# Patient Record
Sex: Female | Born: 1985 | Race: White | Hispanic: No | Marital: Married | Smoking: Never smoker
Health system: Southern US, Community
[De-identification: ages and names within clinical notes are randomized; demographics above are authoritative.]

## PROBLEM LIST (undated history)

## (undated) ENCOUNTER — Inpatient Hospital Stay (HOSPITAL_COMMUNITY): Payer: Self-pay

## (undated) DIAGNOSIS — Z8541 Personal history of malignant neoplasm of cervix uteri: Secondary | ICD-10-CM

## (undated) DIAGNOSIS — C539 Malignant neoplasm of cervix uteri, unspecified: Secondary | ICD-10-CM

## (undated) DIAGNOSIS — J45909 Unspecified asthma, uncomplicated: Secondary | ICD-10-CM

## (undated) DIAGNOSIS — R87629 Unspecified abnormal cytological findings in specimens from vagina: Secondary | ICD-10-CM

## (undated) HISTORY — PX: TONSILLECTOMY: SUR1361

## (undated) HISTORY — PX: WISDOM TOOTH EXTRACTION: SHX21

## (undated) HISTORY — DX: Unspecified abnormal cytological findings in specimens from vagina: R87.629

---

## 2003-10-17 HISTORY — PX: WISDOM TOOTH EXTRACTION: SHX21

## 2005-10-16 DIAGNOSIS — C539 Malignant neoplasm of cervix uteri, unspecified: Secondary | ICD-10-CM

## 2005-10-16 DIAGNOSIS — Z8541 Personal history of malignant neoplasm of cervix uteri: Secondary | ICD-10-CM

## 2005-10-16 HISTORY — PX: LEEP: SHX91

## 2005-10-16 HISTORY — PX: APPENDECTOMY: SHX54

## 2005-10-16 HISTORY — DX: Malignant neoplasm of cervix uteri, unspecified: C53.9

## 2005-10-16 HISTORY — DX: Personal history of malignant neoplasm of cervix uteri: Z85.41

## 2007-08-18 ENCOUNTER — Emergency Department (HOSPITAL_COMMUNITY): Admission: EM | Admit: 2007-08-18 | Discharge: 2007-08-18 | Payer: Self-pay | Admitting: Emergency Medicine

## 2009-01-10 ENCOUNTER — Emergency Department (HOSPITAL_COMMUNITY): Admission: EM | Admit: 2009-01-10 | Discharge: 2009-01-10 | Payer: Self-pay | Admitting: Emergency Medicine

## 2010-04-29 ENCOUNTER — Encounter: Admission: RE | Admit: 2010-04-29 | Discharge: 2010-04-29 | Payer: Self-pay | Admitting: Family Medicine

## 2011-07-25 LAB — RAPID URINE DRUG SCREEN, HOSP PERFORMED
Amphetamines: NOT DETECTED
Barbiturates: NOT DETECTED
Benzodiazepines: NOT DETECTED
Cocaine: NOT DETECTED
Opiates: NOT DETECTED
Tetrahydrocannabinol: NOT DETECTED

## 2011-07-25 LAB — POCT CARDIAC MARKERS
CKMB, poc: <1 — ABNORMAL LOW
Myoglobin, poc: 38.7
Operator id: 272551
Troponin i, poc: <0.05

## 2011-07-25 LAB — D-DIMER, QUANTITATIVE: D-Dimer, Quant: 0.24

## 2012-02-29 ENCOUNTER — Ambulatory Visit: Payer: Self-pay | Admitting: Emergency Medicine

## 2012-02-29 VITALS — BP 134/82 | HR 90 | Temp 97.8°F | Resp 18 | Ht 66.0 in | Wt 133.0 lb

## 2012-02-29 DIAGNOSIS — R1110 Vomiting, unspecified: Secondary | ICD-10-CM

## 2012-02-29 DIAGNOSIS — R197 Diarrhea, unspecified: Secondary | ICD-10-CM

## 2012-02-29 DIAGNOSIS — E860 Dehydration: Secondary | ICD-10-CM

## 2012-02-29 LAB — POCT CBC
HCT, POC: 43.8 % (ref 37.7–47.9)
MCHC: 34.2 g/dL (ref 31.8–35.4)
MCV: 94.1 fL (ref 80–97)
MID (cbc): 0.4 (ref 0–0.9)
MPV: 11.4 fL (ref 0–99.8)
POC Granulocyte: 10.7 — AB (ref 2–6.9)
POC MID %: 3.2 %M (ref 0–12)
RBC: 4.65 M/uL (ref 4.04–5.48)
RDW, POC: 12.6 %
WBC: 12.2 10*3/uL — AB (ref 4.6–10.2)

## 2012-02-29 MED ORDER — ONDANSETRON 8 MG PO TBDP: 8.0000 mg | ORAL_TABLET | Freq: Three times a day (TID) | ORAL | Status: AC | PRN

## 2012-02-29 MED ORDER — ONDANSETRON 4 MG PO TBDP
8.0000 mg | ORAL_TABLET | Freq: Once | ORAL | Status: AC
  Administered 2012-02-29: 8 mg via ORAL

## 2012-02-29 NOTE — Patient Instructions (Signed)
Nausea and Vomiting  Nausea is a sick feeling that often comes before throwing up (vomiting). Vomiting is a reflex where stomach contents come out of your mouth. Vomiting can cause severe loss of body fluids (dehydration). Children and elderly adults can become dehydrated quickly, especially if they also have diarrhea. Nausea and vomiting are symptoms of a condition or disease. It is important to find the cause of your symptoms.  CAUSES    Direct irritation of the stomach lining. This irritation can result from increased acid production (gastroesophageal reflux disease), infection, food poisoning, taking certain medicines (such as nonsteroidal anti-inflammatory drugs), alcohol use, or tobacco use.   Signals from the brain.These signals could be caused by a headache, heat exposure, an inner ear disturbance, increased pressure in the brain from injury, infection, a tumor, or a concussion, pain, emotional stimulus, or metabolic problems.   An obstruction in the gastrointestinal tract (bowel obstruction).   Illnesses such as diabetes, hepatitis, gallbladder problems, appendicitis, kidney problems, cancer, sepsis, atypical symptoms of a heart attack, or eating disorders.   Medical treatments such as chemotherapy and radiation.   Receiving medicine that makes you sleep (general anesthetic) during surgery.  DIAGNOSIS  Your caregiver may ask for tests to be done if the problems do not improve after a few days. Tests may also be done if symptoms are severe or if the reason for the nausea and vomiting is not clear. Tests may include:   Urine tests.   Blood tests.   Stool tests.   Cultures (to look for evidence of infection).   X-rays or other imaging studies.  Test results can help your caregiver make decisions about treatment or the need for additional tests.  TREATMENT  You need to stay well hydrated. Drink frequently but in small amounts.You may wish to drink water, sports drinks, clear broth, or eat frozen  ice pops or gelatin dessert to help stay hydrated.When you eat, eating slowly may help prevent nausea.There are also some antinausea medicines that may help prevent nausea.  HOME CARE INSTRUCTIONS    Take all medicine as directed by your caregiver.   If you do not have an appetite, do not force yourself to eat. However, you must continue to drink fluids.   If you have an appetite, eat a normal diet unless your caregiver tells you differently.   Eat a variety of complex carbohydrates (rice, wheat, potatoes, bread), lean meats, yogurt, fruits, and vegetables.   Avoid high-fat foods because they are more difficult to digest.   Drink enough water and fluids to keep your urine clear or pale yellow.   If you are dehydrated, ask your caregiver for specific rehydration instructions. Signs of dehydration may include:   Severe thirst.   Dry lips and mouth.   Dizziness.   Dark urine.   Decreasing urine frequency and amount.   Confusion.   Rapid breathing or pulse.  SEEK IMMEDIATE MEDICAL CARE IF:    You have blood or brown flecks (like coffee grounds) in your vomit.   You have black or bloody stools.   You have a severe headache or stiff neck.   You are confused.   You have severe abdominal pain.   You have chest pain or trouble breathing.   You do not urinate at least once every 8 hours.   You develop cold or clammy skin.   You continue to vomit for longer than 24 to 48 hours.   You have a fever.  MAKE SURE YOU:      Understand these instructions.   Will watch your condition.   Will get help right away if you are not doing well or get worse.  Document Released: 10/02/2005 Document Revised: 09/21/2011 Document Reviewed: 03/01/2011  [employer] Patient Information 2012 [employer].

## 2012-02-29 NOTE — Progress Notes (Signed)
Addended by: Lesle Chris A on: 02/29/2012 05:15 PM   Modules accepted: Orders

## 2012-02-29 NOTE — Progress Notes (Signed)
  Subjective:    Patient ID: Michelle Copeland, female    DOB: 06-14-1986, 26 y.o.   MRN: [id number]  HPI patient started him bad last night. She started having frequent loose stools the today she's had persistent nausea and vomiting inability to stop vomiting.    Review of Systems     Objective:   Physical Exam HEENT exam reveals a young female who appears ill. She does not appear toxic. Her mucous membranes are slightly dry . Her neck is supple. Chest is clear to auscultation and percussion. The abdomen is flat. Bowel sounds are normal there is mild midepigastric tenderness.   Results for orders placed in visit on 02/29/12  POCT CBC      Component Value Range   WBC 12.2 (*) 4.6 - 10.2 (K/uL)   Lymph, poc 1.2  0.6 - 3.4    POC LYMPH PERCENT 9.5 (*) 10 - 50 (%L)   MID (cbc) 0.4  0 - 0.9    POC MID % 3.2  0 - 12 (%M)   POC Granulocyte 10.7 (*) 2 - 6.9    Granulocyte percent 87.3 (*) 37 - 80 (%G)   RBC 4.65  4.04 - 5.48 (M/uL)   Hemoglobin 15.0  12.2 - 16.2 (g/dL)   HCT, POC 24.4  01.0 - 47.9 (%)   MCV 94.1  80 - 97 (fL)   MCH, POC 32.3 (*) 27 - 31.2 (pg)   MCHC 34.2  31.8 - 35.4 (g/dL)   RDW, POC 27.2     Platelet Count, POC 267  142 - 424 (K/uL)   MPV 11.4  0 - 99.8 (fL)       Assessment & Plan:  Assessment as nausea vomiting diarrhea with severe headache no fever consistent with a viral type gastroenteritis. We'll give Zofran by mouth ODT and start IV fluids. Her white count is up slightly however she is in the acute phase of her illness and I suspect this is still more a reflection of an early viral type illness. Patient is significantly better after Zofran and IV fluids. We will put her on by mouth Zofran and instructions for vomiting and diarrhea to return to clinic if not improving in 24-48 hours

## 2013-10-14 ENCOUNTER — Encounter: Payer: Self-pay | Admitting: *Deleted

## 2013-10-14 ENCOUNTER — Other Ambulatory Visit (INDEPENDENT_AMBULATORY_CARE_PROVIDER_SITE_OTHER): Payer: BC Managed Care – PPO | Admitting: *Deleted

## 2013-10-14 DIAGNOSIS — N926 Irregular menstruation, unspecified: Secondary | ICD-10-CM

## 2013-10-14 LAB — HIV ANTIBODY (ROUTINE TESTING W REFLEX): HIV: NONREACTIVE

## 2013-10-14 LAB — POCT PREGNANCY, URINE: Preg Test, Ur: POSITIVE — AB

## 2013-10-15 LAB — OBSTETRIC PANEL
Antibody Screen: NEGATIVE
Eosinophils Relative: 2 % (ref 0–5)
HCT: 41.2 % (ref 36.0–46.0)
Lymphocytes Relative: 28 % (ref 12–46)
Lymphs Abs: 1.6 10*3/uL (ref 0.7–4.0)
MCH: 32.2 pg (ref 26.0–34.0)
MCHC: 34.2 g/dL (ref 30.0–36.0)
Neutro Abs: 3.7 10*3/uL (ref 1.7–7.7)
RBC: 4.38 MIL/uL (ref 3.87–5.11)
RDW: 12.8 % (ref 11.5–15.5)
Rubella: 1.11 Index — ABNORMAL HIGH (ref <0.90)
WBC: 5.8 10*3/uL (ref 4.0–10.5)

## 2013-10-16 NOTE — L&D Delivery Note (Signed)
Delivery Note Pt reached complete dilation and pushed great.  At 10:01 PM a healthy female was delivered via Vaginal, Spontaneous Delivery (Presentation: ; Occiput Anterior).  APGAR: 8, 9; weight pending .   Placenta status: Intact, Spontaneous.  Cord: 3 vessels with the following complications: None.   Anesthesia: Epidural  Episiotomy: None Lacerations: 1st degree Suture Repair: 3.0 vicryl rapide Est. Blood Loss (mL): 350cc  Mom to postpartum.  Baby to Couplet care / Skin to Skin.  Logan Bores 05/30/2014, 10:19 PM

## 2013-11-04 ENCOUNTER — Encounter: Payer: Self-pay | Admitting: Obstetrics and Gynecology

## 2013-11-04 ENCOUNTER — Ambulatory Visit (HOSPITAL_COMMUNITY)
Admission: RE | Admit: 2013-11-04 | Discharge: 2013-11-04 | Disposition: A | Discharge status: Home / Self Care | Payer: BC Managed Care – PPO | Source: Ambulatory Visit | Attending: Obstetrics and Gynecology | Admitting: Obstetrics and Gynecology

## 2013-11-04 ENCOUNTER — Ambulatory Visit (INDEPENDENT_AMBULATORY_CARE_PROVIDER_SITE_OTHER): Payer: BC Managed Care – PPO | Admitting: Obstetrics and Gynecology

## 2013-11-04 VITALS — BP 122/81 | Temp 97.6°F | Wt 139.4 lb

## 2013-11-04 DIAGNOSIS — Z3689 Encounter for other specified antenatal screening: Secondary | ICD-10-CM | POA: Insufficient documentation

## 2013-11-04 DIAGNOSIS — Z3401 Encounter for supervision of normal first pregnancy, first trimester: Secondary | ICD-10-CM

## 2013-11-04 DIAGNOSIS — Z3400 Encounter for supervision of normal first pregnancy, unspecified trimester: Secondary | ICD-10-CM

## 2013-11-04 DIAGNOSIS — O3441 Maternal care for other abnormalities of cervix, first trimester: Secondary | ICD-10-CM | POA: Insufficient documentation

## 2013-11-04 DIAGNOSIS — Z9889 Other specified postprocedural states: Secondary | ICD-10-CM

## 2013-11-04 LAB — POCT URINALYSIS DIP (DEVICE)
Bilirubin Urine: NEGATIVE
Glucose, UA: NEGATIVE mg/dL
Hgb urine dipstick: NEGATIVE
Leukocytes, UA: NEGATIVE
Nitrite: NEGATIVE
PROTEIN: NEGATIVE mg/dL
Specific Gravity, Urine: 1.02 (ref 1.005–1.030)
Urobilinogen, UA: 0.2 mg/dL (ref 0.0–1.0)
pH: 5.5 (ref 5.0–8.0)

## 2013-11-04 NOTE — Addendum Note (Signed)
Addended by: Ernie Avena on: 11/04/2013 03:41 PM   Modules accepted: Orders

## 2013-11-04 NOTE — Progress Notes (Signed)
P=84,  Here for initial OB. States LMP around 08/04/13, not sure of exact date. Given new patient information. Discussed bmi/ appropriate weight gain. Last pap June, 2014.

## 2013-11-04 NOTE — Patient Instructions (Addendum)
Pregnancy - First Trimester  During sexual intercourse, millions of sperm go into the vagina. Only 1 sperm will penetrate and fertilize the female egg while it is in the Fallopian tube. One week later, the fertilized egg implants into the wall of the uterus. An embryo begins to develop into a baby. At 6 to 8 weeks, the eyes and face are formed and the heartbeat can be seen on ultrasound. At the end of 12 weeks (first trimester), all the baby's organs are formed. Now that you are pregnant, you will want to do everything you can to have a healthy baby. Two of the most important things are to get good prenatal care and follow your caregiver's instructions. Prenatal care is all the medical care you receive before the baby's birth. It is given to prevent, find, and treat problems during the pregnancy and childbirth.  PRENATAL EXAMS  · During prenatal visits, your weight, blood pressure, and urine are checked. This is done to make sure you are healthy and progressing normally during the pregnancy.  · A pregnant woman should gain 25 to 35 pounds during the pregnancy. However, if you are overweight or underweight, your caregiver will advise you regarding your weight.  · Your caregiver will ask and answer questions for you.  · Blood work, cervical cultures, other necessary tests, and a Pap test are done during your prenatal exams. These tests are done to check on your health and the probable health of your baby. Tests are strongly recommended and done for HIV with your permission. This is the virus that causes AIDS. These tests are done because medicines can be given to help prevent your baby from being born with this infection should you have been infected without knowing it. Blood work is also used to find out your blood type, previous infections, and follow your blood levels (hemoglobin).  · Low hemoglobin (anemia) is common during pregnancy. Iron and vitamins are given to help prevent this. Later in the pregnancy, blood  tests for diabetes will be done along with any other tests if any problems develop.  · You may need other tests to make sure you and the baby are doing well.  CHANGES DURING THE FIRST TRIMESTER   Your body goes through many changes during pregnancy. They vary from person to person. Talk to your caregiver about changes you notice and are concerned about. Changes can include:  · Your menstrual period stops.  · The egg and sperm carry the genes that determine what you look like. Genes from you and your partner are forming a baby. The female genes determine whether the baby is a boy or a girl.  · Your body increases in girth and you may feel bloated.  · Feeling sick to your stomach (nauseous) and throwing up (vomiting). If the vomiting is uncontrollable, call your caregiver.  · Your breasts will begin to enlarge and become tender.  · Your nipples may stick out more and become darker.  · The need to urinate more. Painful urination may mean you have a bladder infection.  · Tiring easily.  · Loss of appetite.  · Cravings for certain kinds of food.  · At first, you may gain or lose a couple of pounds.  · You may have changes in your emotions from day to day (excited to be pregnant or concerned something may go wrong with the pregnancy and baby).  · You may have more vivid and strange dreams.  HOME CARE INSTRUCTIONS   ·   It is very important to avoid all smoking, alcohol and non-prescribed drugs during your pregnancy. These affect the formation and growth of the baby. Avoid chemicals while pregnant to ensure the delivery of a healthy infant.  · Start your prenatal visits by the 12th week of pregnancy. They are usually scheduled monthly at first, then more often in the last 2 months before delivery. Keep your caregiver's appointments. Follow your caregiver's instructions regarding medicine use, blood and lab tests, exercise, and diet.  · During pregnancy, you are providing food for you and your baby. Eat regular, well-balanced  meals. Choose foods such as meat, fish, milk and other low fat dairy products, vegetables, fruits, and whole-grain breads and cereals. Your caregiver will tell you of the ideal weight gain.  · You can help morning sickness by keeping soda crackers at the bedside. Eat a couple before arising in the morning. You may want to use the crackers without salt on them.  · Eating 4 to 5 small meals rather than 3 large meals a day also may help the nausea and vomiting.  · Drinking liquids between meals instead of during meals also seems to help nausea and vomiting.  · A physical sexual relationship may be continued throughout pregnancy if there are no other problems. Problems may be early (premature) leaking of amniotic fluid from the membranes, vaginal bleeding, or belly (abdominal) pain.  · Exercise regularly if there are no restrictions. Check with your caregiver or physical therapist if you are unsure of the safety of some of your exercises. Greater weight gain will occur in the last 2 trimesters of pregnancy. Exercising will help:  · Control your weight.  · Keep you in shape.  · Prepare you for labor and delivery.  · Help you lose your pregnancy weight after you deliver your baby.  · Wear a good support or jogging bra for breast tenderness during pregnancy. This may help if worn during sleep too.  · Ask when prenatal classes are available. Begin classes when they are offered.  · Do not use hot tubs, steam rooms, or saunas.  · Wear your seat belt when driving. This protects you and your baby if you are in an accident.  · Avoid raw meat, uncooked cheese, cat litter boxes, and soil used by cats throughout the pregnancy. These carry germs that can cause birth defects in the baby.  · The first trimester is a good time to visit your dentist for your dental health. Getting your teeth cleaned is okay. Use a softer toothbrush and brush gently during pregnancy.  · Ask for help if you have financial, counseling, or nutritional needs  during pregnancy. Your caregiver will be able to offer counseling for these needs as well as refer you for other special needs.  · Do not take any medicines or herbs unless told by your caregiver.  · Inform your caregiver if there is any mental or physical domestic violence.  · Make a list of emergency phone numbers of family, friends, hospital, and police and fire departments.  · Write down your questions. Take them to your prenatal visit.  · Do not douche.  · Do not cross your legs.  · If you have to stand for long periods of time, rotate you feet or take small steps in a circle.  · You may have more vaginal secretions that may require a sanitary pad. Do not use tampons or scented sanitary pads.  MEDICINES AND DRUG USE IN PREGNANCY  ·   Take prenatal vitamins as directed. The vitamin should contain 1 milligram of folic acid. Keep all vitamins out of reach of children. Only a couple vitamins or tablets containing iron may be fatal to a baby or young child when ingested.  · Avoid use of all medicines, including herbs, over-the-counter medicines, not prescribed or suggested by your caregiver. Only take over-the-counter or prescription medicines for pain, discomfort, or fever as directed by your caregiver. Do not use aspirin, ibuprofen, or naproxen unless directed by your caregiver.  · Let your caregiver also know about herbs you may be using.  · Alcohol is related to a number of birth defects. This includes fetal alcohol syndrome. All alcohol, in any form, should be avoided completely. Smoking will cause low birth rate and premature babies.  · Street or illegal drugs are very harmful to the baby. They are absolutely forbidden. A baby born to an addicted mother will be addicted at birth. The baby will go through the same withdrawal an adult does.  · Let your caregiver know about any medicines that you have to take and for what reason you take them.  SEEK MEDICAL CARE IF:   You have any concerns or worries during your  pregnancy. It is better to call with your questions if you feel they cannot wait, rather than worry about them.  SEEK IMMEDIATE MEDICAL CARE IF:   · An unexplained oral temperature above 102° F (38.9° C) develops, or as your caregiver suggests.  · You have leaking of fluid from the vagina (birth canal). If leaking membranes are suspected, take your temperature and inform your caregiver of this when you call.  · There is vaginal spotting or bleeding. Notify your caregiver of the amount and how many pads are used.  · You develop a bad smelling vaginal discharge with a change in the color.  · You continue to feel sick to your stomach (nauseated) and have no relief from remedies suggested. You vomit blood or coffee ground-like materials.  · You lose more than 2 pounds of weight in 1 week.  · You gain more than 2 pounds of weight in 1 week and you notice swelling of your face, hands, feet, or legs.  · You gain 5 pounds or more in 1 week (even if you do not have swelling of your hands, face, legs, or feet).  · You get exposed to German measles and have never had them.  · You are exposed to fifth disease or chickenpox.  · You develop belly (abdominal) pain. Round ligament discomfort is a common non-cancerous (benign) cause of abdominal pain in pregnancy. Your caregiver still must evaluate this.  · You develop headache, fever, diarrhea, pain with urination, or shortness of breath.  · You fall or are in a car accident or have any kind of trauma.  · There is mental or physical violence in your home.  Document Released: 09/26/2001 Document Revised: 06/26/2012 Document Reviewed: 03/30/2009  [employer]® Patient Information ©2014 [employer].

## 2013-11-04 NOTE — Progress Notes (Signed)
   Subjective:    Michelle Copeland is a G1P0 [redacted]w[redacted]d being seen today for her first obstetrical visit.  Patient does intend to breast feed. Pregnancy history fully reviewed.  Patient reports constipation, but symptoms relieved with change in PNV brand.  Filed Vitals:   11/04/13 1333  BP: 122/81  Temp: 97.6 F (36.4 C)  Weight: 139 lb 6.4 oz (63.231 kg)    HISTORY: OB History  Gravida Para Term Preterm AB SAB TAB Ectopic Multiple Living  1             # Outcome Date GA Lbr Len/2nd Weight Sex Delivery Anes PTL Lv  1 CUR              Past Medical History  Diagnosis Date  . Vaginal Pap smear, abnormal    Past Surgical History  Procedure Laterality Date  . Leep  2007  . Appendectomy  2007  . Wisdom tooth extraction  2005   Family History  Problem Relation Age of Onset  . Lupus Mother   . Hypertension Mother   . Thyroid cancer Mother   . Hyperlipidemia Mother      Exam    Uterus:     Pelvic Exam:    Perineum: Normal Perineum   Vulva: normal   Vagina:  normal mucosa, normal discharge   pH:    Cervix: no cervical motion tenderness and bleeding following PAP   Adnexa: normal adnexa and no mass, fullness, tenderness   Bony Pelvis: average  System: Breast:  normal appearance, no masses or tenderness   Skin: normal coloration and turgor, no rashes    Neurologic: oriented, normal   Extremities: normal strength, tone, and muscle mass   HEENT sclera clear, anicteric, neck supple with midline trachea and thyroid without masses   Mouth/Teeth mucous membranes moist, pharynx normal without lesions and dental hygiene good   Neck supple and no masses   Cardiovascular: regular rate and rhythm   Respiratory:  appears well, vitals normal, no respiratory distress, acyanotic, normal RR, ear and throat exam is normal, neck free of mass or lymphadenopathy, chest clear, no wheezing, crepitations, rhonchi, normal symmetric air entry   Abdomen: soft, non-tender; bowel sounds normal; no  masses,  no organomegaly   Urinary: urethral meatus normal      Assessment:    Pregnancy: G1P0 Patient Active Problem List   Diagnosis Date Noted  . Supervision of normal first pregnancy in first trimester 11/04/2013  . History of LEEP (loop electrosurgical excision procedure) of cervix complicating pregnancy in first trimester 11/04/2013        Plan:     Initial labs drawn. Prenatal vitamins. Problem list reviewed and updated. Genetic Screening discussed First Screen: declined.  Ultrasound discussed; fetal survey: ordered.  Follow up in 4 weeks. 50% of 30 min visit spent on counseling and coordination of care.     Ivin Booty 11/04/2013

## 2013-11-05 LAB — PRESCRIPTION MONITORING PROFILE (19 PANEL)
AMPHETAMINE/METH: NEGATIVE ng/mL
BARBITURATE SCREEN, URINE: NEGATIVE ng/mL
Benzodiazepine Screen, Urine: NEGATIVE ng/mL
Buprenorphine, Urine: NEGATIVE ng/mL
CARISOPRODOL, URINE: NEGATIVE ng/mL
COCAINE METABOLITES: NEGATIVE ng/mL
Cannabinoid Scrn, Ur: NEGATIVE ng/mL
Creatinine, Urine: 146.73 mg/dL (ref >20.0)
ECSTASY: NEGATIVE ng/mL
Fentanyl, Ur: NEGATIVE ng/mL
MEPERIDINE UR: NEGATIVE ng/mL
METHAQUALONE SCREEN (URINE): NEGATIVE ng/mL
Methadone Screen, Urine: NEGATIVE ng/mL
NITRITES URINE, INITIAL: NEGATIVE ug/mL
OPIATE SCREEN, URINE: NEGATIVE ng/mL
OXYCODONE SCRN UR: NEGATIVE ng/mL
PH URINE, INITIAL: 5.8 pH (ref 4.5–8.9)
PROPOXYPHENE: NEGATIVE ng/mL
Phencyclidine, Ur: NEGATIVE ng/mL
TAPENTADOLUR: NEGATIVE ng/mL
TRAMADOL UR: NEGATIVE ng/mL
Zolpidem, Urine: NEGATIVE ng/mL

## 2013-11-06 LAB — CULTURE, OB URINE
COLONY COUNT: NO GROWTH
ORGANISM ID, BACTERIA: NO GROWTH

## 2013-12-02 ENCOUNTER — Encounter: Payer: BC Managed Care – PPO | Admitting: Family

## 2014-03-06 LAB — OB RESULTS CONSOLE GC/CHLAMYDIA
Chlamydia: NEGATIVE
Gonorrhea: NEGATIVE

## 2014-05-03 ENCOUNTER — Inpatient Hospital Stay (HOSPITAL_COMMUNITY)
Admission: AD | Admit: 2014-05-03 | Discharge: 2014-05-03 | Disposition: A | Discharge status: Home / Self Care | Payer: BC Managed Care – PPO | Source: Ambulatory Visit | Attending: Obstetrics and Gynecology | Admitting: Obstetrics and Gynecology

## 2014-05-03 ENCOUNTER — Encounter (HOSPITAL_COMMUNITY): Payer: Self-pay | Admitting: *Deleted

## 2014-05-03 DIAGNOSIS — Z8541 Personal history of malignant neoplasm of cervix uteri: Secondary | ICD-10-CM | POA: Insufficient documentation

## 2014-05-03 DIAGNOSIS — O4700 False labor before 37 completed weeks of gestation, unspecified trimester: Secondary | ICD-10-CM | POA: Insufficient documentation

## 2014-05-03 HISTORY — DX: Malignant neoplasm of cervix uteri, unspecified: C53.9

## 2014-05-03 LAB — URINALYSIS, ROUTINE W REFLEX MICROSCOPIC
BILIRUBIN URINE: NEGATIVE
Glucose, UA: NEGATIVE mg/dL
Ketones, ur: NEGATIVE mg/dL
LEUKOCYTES UA: NEGATIVE
Nitrite: NEGATIVE
PROTEIN: NEGATIVE mg/dL
Specific Gravity, Urine: <1.005 — ABNORMAL LOW (ref 1.005–1.030)
UROBILINOGEN UA: 0.2 mg/dL (ref 0.0–1.0)
pH: 6 (ref 5.0–8.0)

## 2014-05-03 LAB — URINE MICROSCOPIC-ADD ON

## 2014-05-03 MED ORDER — LACTATED RINGERS IV BOLUS (SEPSIS)
1000.0000 mL | Freq: Once | INTRAVENOUS | Status: AC
  Administered 2014-05-03: 1000 mL via INTRAVENOUS

## 2014-05-03 MED ORDER — ZOLPIDEM TARTRATE 5 MG PO TABS
5.0000 mg | ORAL_TABLET | Freq: Once | ORAL | Status: AC
  Administered 2014-05-03: 5 mg via ORAL
  Filled 2014-05-03: qty 1

## 2014-05-03 MED ORDER — LACTATED RINGERS IV SOLN
INTRAVENOUS | Status: DC
  Administered 2014-05-03: 05:00:00 via INTRAVENOUS

## 2014-05-03 NOTE — MAU Note (Signed)
Reports having contractions that started around 2315 anywhere from 5-14 minutes apart. Called Dr Melba Coon and was instructed to lay on left side and drink water however that has not helped. Denies VB and LOF. Reports good FM

## 2014-05-03 NOTE — MAU Provider Note (Signed)
  History     CSN: [id number]  Arrival date and time: 05/03/14 0234   First Provider Initiated Contact with Patient 05/03/14 0301      No chief complaint on file.  HPI Pt is G1P0 @ [redacted]weeks gestation.  Pt started having intense ctx/abd pain  around 2315 from 5 to 14 minutes.  Pt drank Water and tried to get comfortable on her left side without any improvement of her pain.  Pt denies vaginal bleeding or Loss of fluid.  Pt's hx is significant for cervical cancer treated with cryo, laser and radiation.   Registered Nurse Signed MAU Note Service date: 05/03/2014 2:51 AM   Reports having contractions that started around 2315 anywhere from 5-14 minutes apart. Called Dr Melba Coon and was instructed to lay on left side and drink water however that has not helped. Denies VB and LOF. Reports good FM     Past Medical History  Diagnosis Date  . Vaginal Pap smear, abnormal   . Cervical cancer 2007    Treated with radiation, laser and cryo    Past Surgical History  Procedure Laterality Date  . Leep  2007  . Appendectomy  2007  . Wisdom tooth extraction  2005  . Tonsillectomy    . Wisdom tooth extraction      Family History  Problem Relation Age of Onset  . Lupus Mother   . Hypertension Mother   . Thyroid cancer Mother   . Hyperlipidemia Mother     History  Substance Use Topics  . Smoking status: Never Smoker   . Smokeless tobacco: Never Used  . Alcohol Use: Yes     Comment: stopped since pregant    Allergies:  Allergies  Allergen Reactions  . Codeine Hives    Prescriptions prior to admission  Medication Sig Dispense Refill  . Prenatal Vit-Fe Fumarate-FA (PRENATAL VITAMINS PLUS PO) Take 1 tablet by mouth daily.        Review of Systems  Constitutional: Negative for fever and chills.  Gastrointestinal: Positive for abdominal pain. Negative for nausea, vomiting, diarrhea and constipation.  Genitourinary: Negative for dysuria.   Physical Exam   Blood pressure 124/66,  pulse 90, temperature 98 F (36.7 C), temperature source Oral, resp. rate 20, height 5\' 6"  (1.676 m), weight 168 lb (76.204 kg), last menstrual period 09/04/2013.  Physical Exam  Nursing note and vitals reviewed. Constitutional: She is oriented to person, place, and time. She appears well-developed and well-nourished.  Appears uncomfortable  HENT:  Head: Normocephalic.  Eyes: Pupils are equal, round, and reactive to light.  Neck: Normal range of motion. Neck supple.  Cardiovascular: Normal rate.   Respiratory: Effort normal.  GI: Soft. She exhibits no distension.  Irregular contractions; FHR good variability, no decelerations  Genitourinary:  Cervix 1 cm thick -3station  Musculoskeletal: Normal range of motion.  Neurological: She is alert and oriented to person, place, and time.  Skin: Skin is warm and dry.  Psychiatric: She has a normal mood and affect.    MAU Course  Procedures Discussed with Dr. Melba Coon- will give LR bolus Care turned over to RN for labor check Assessment and Plan    Michelle Copeland 05/03/2014, 3:07 AM

## 2014-05-03 NOTE — MAU Provider Note (Signed)
  Pt in MAU with contractions.  NST - 130-135 R.  irr ctx, given IVF bolus

## 2014-05-03 NOTE — Discharge Instructions (Signed)

## 2014-05-13 LAB — OB RESULTS CONSOLE GBS: GBS: NEGATIVE

## 2014-05-30 ENCOUNTER — Encounter (HOSPITAL_COMMUNITY): Payer: Self-pay | Admitting: *Deleted

## 2014-05-30 ENCOUNTER — Inpatient Hospital Stay (HOSPITAL_COMMUNITY)
Admission: AD | Admit: 2014-05-30 | Discharge: 2014-06-01 | DRG: 775 | Disposition: A | Discharge status: Home / Self Care | Payer: BC Managed Care – PPO | Source: Ambulatory Visit | Attending: Obstetrics and Gynecology | Admitting: Obstetrics and Gynecology

## 2014-05-30 ENCOUNTER — Inpatient Hospital Stay (HOSPITAL_COMMUNITY): Payer: BC Managed Care – PPO | Admitting: Anesthesiology

## 2014-05-30 ENCOUNTER — Encounter (HOSPITAL_COMMUNITY): Payer: BC Managed Care – PPO | Admitting: Anesthesiology

## 2014-05-30 DIAGNOSIS — Z8541 Personal history of malignant neoplasm of cervix uteri: Secondary | ICD-10-CM

## 2014-05-30 DIAGNOSIS — J45909 Unspecified asthma, uncomplicated: Secondary | ICD-10-CM | POA: Diagnosis present

## 2014-05-30 DIAGNOSIS — Z8249 Family history of ischemic heart disease and other diseases of the circulatory system: Secondary | ICD-10-CM

## 2014-05-30 DIAGNOSIS — Z808 Family history of malignant neoplasm of other organs or systems: Secondary | ICD-10-CM | POA: Diagnosis not present

## 2014-05-30 DIAGNOSIS — O479 False labor, unspecified: Secondary | ICD-10-CM | POA: Diagnosis present

## 2014-05-30 HISTORY — DX: Unspecified asthma, uncomplicated: J45.909

## 2014-05-30 LAB — CBC
HCT: 42.9 % (ref 36.0–46.0)
Hemoglobin: 15.6 g/dL — ABNORMAL HIGH (ref 12.0–15.0)
MCH: 33.8 pg (ref 26.0–34.0)
MCHC: 36.4 g/dL — AB (ref 30.0–36.0)
MCV: 92.9 fL (ref 78.0–100.0)
Platelets: 202 10*3/uL (ref 150–400)
RBC: 4.62 MIL/uL (ref 3.87–5.11)
RDW: 13 % (ref 11.5–15.5)
WBC: 14.8 10*3/uL — ABNORMAL HIGH (ref 4.0–10.5)

## 2014-05-30 MED ORDER — LACTATED RINGERS IV SOLN
500.0000 mL | Freq: Once | INTRAVENOUS | Status: AC
  Administered 2014-05-30: 500 mL via INTRAVENOUS

## 2014-05-30 MED ORDER — EPHEDRINE 5 MG/ML INJ
10.0000 mg | INTRAVENOUS | Status: DC | PRN
  Filled 2014-05-30: qty 2
  Filled 2014-05-30: qty 4

## 2014-05-30 MED ORDER — LIDOCAINE HCL (PF) 1 % IJ SOLN
30.0000 mL | INTRAMUSCULAR | Status: DC | PRN
  Filled 2014-05-30: qty 30

## 2014-05-30 MED ORDER — OXYTOCIN BOLUS FROM INFUSION: 500.0000 mL | INTRAVENOUS | Status: DC

## 2014-05-30 MED ORDER — PHENYLEPHRINE 40 MCG/ML (10ML) SYRINGE FOR IV PUSH (FOR BLOOD PRESSURE SUPPORT)
80.0000 ug | PREFILLED_SYRINGE | INTRAVENOUS | Status: DC | PRN
  Administered 2014-05-30: 80 ug via INTRAVENOUS
  Filled 2014-05-30: qty 2

## 2014-05-30 MED ORDER — LACTATED RINGERS IV SOLN
INTRAVENOUS | Status: DC
  Administered 2014-05-30 (×2): via INTRAVENOUS

## 2014-05-30 MED ORDER — CITRIC ACID-SODIUM CITRATE 334-500 MG/5ML PO SOLN
30.0000 mL | ORAL | Status: DC | PRN
  Filled 2014-05-30: qty 15

## 2014-05-30 MED ORDER — ACETAMINOPHEN 325 MG PO TABS: 650.0000 mg | ORAL_TABLET | ORAL | Status: DC | PRN

## 2014-05-30 MED ORDER — DIPHENHYDRAMINE HCL 50 MG/ML IJ SOLN: 12.5000 mg | INTRAMUSCULAR | Status: DC | PRN

## 2014-05-30 MED ORDER — LACTATED RINGERS IV SOLN
500.0000 mL | INTRAVENOUS | Status: DC | PRN
  Administered 2014-05-30 (×2): 500 mL via INTRAVENOUS

## 2014-05-30 MED ORDER — FENTANYL 2.5 MCG/ML BUPIVACAINE 1/10 % EPIDURAL INFUSION (WH - ANES)
14.0000 mL/h | INTRAMUSCULAR | Status: DC | PRN
  Administered 2014-05-30: 14 mL/h via EPIDURAL
  Filled 2014-05-30: qty 125

## 2014-05-30 MED ORDER — OXYTOCIN 40 UNITS IN LACTATED RINGERS INFUSION - SIMPLE MED
62.5000 mL/h | INTRAVENOUS | Status: DC
  Administered 2014-05-30: 62.5 mL/h via INTRAVENOUS
  Filled 2014-05-30: qty 1000

## 2014-05-30 MED ORDER — IBUPROFEN 600 MG PO TABS
600.0000 mg | ORAL_TABLET | Freq: Four times a day (QID) | ORAL | Status: DC | PRN
  Administered 2014-05-30: 600 mg via ORAL
  Filled 2014-05-30: qty 1

## 2014-05-30 MED ORDER — TERBUTALINE SULFATE 1 MG/ML IJ SOLN
INTRAMUSCULAR | Status: AC
  Filled 2014-05-30: qty 1

## 2014-05-30 MED ORDER — LIDOCAINE HCL (PF) 1 % IJ SOLN
INTRAMUSCULAR | Status: DC | PRN
  Administered 2014-05-30 (×2): 5 mL

## 2014-05-30 MED ORDER — BUTORPHANOL TARTRATE 1 MG/ML IJ SOLN
1.0000 mg | INTRAMUSCULAR | Status: DC | PRN
  Administered 2014-05-30: 1 mg via INTRAVENOUS
  Filled 2014-05-30: qty 1

## 2014-05-30 MED ORDER — ONDANSETRON HCL 4 MG/2ML IJ SOLN: 4.0000 mg | Freq: Four times a day (QID) | INTRAMUSCULAR | Status: DC | PRN

## 2014-05-30 MED ORDER — EPHEDRINE 5 MG/ML INJ
10.0000 mg | INTRAVENOUS | Status: DC | PRN
  Filled 2014-05-30: qty 2

## 2014-05-30 MED ORDER — PHENYLEPHRINE 40 MCG/ML (10ML) SYRINGE FOR IV PUSH (FOR BLOOD PRESSURE SUPPORT)
80.0000 ug | PREFILLED_SYRINGE | INTRAVENOUS | Status: DC | PRN
  Filled 2014-05-30: qty 2
  Filled 2014-05-30: qty 10

## 2014-05-30 NOTE — Progress Notes (Signed)
Patient ID: Michelle Copeland, female   DOB: [birth year], 28 y.o.   MRN: [id number] Pt received epidural after progressed on her own to 4-5cm. She was comfortable after about an hour. I was CTSP about 840pm for repetitive variable decels to 70-80 with prolonged recovery.  Baby recovered  With O2, position change, and epedrine for BP support.  RN placed FSE and had AROM of bag at that time with clear fluid.  Baby now recovered to 130-140 with great accel with scalp stim and good variability.  Some mild variable decels as well  Cervix 8-9/c/0  Pt making rapid progress, expect vaginal delivery in next 1-2 hours.

## 2014-05-30 NOTE — MAU Note (Signed)
[place], RN charge notified of pt, will go to room 165.

## 2014-05-30 NOTE — H&P (Signed)
Michelle Copeland is a 28 y.o. female G1P0 at 47 6/7 weeks (EDD 06/14/14 by LMP c/w 8 week [place]) presenting for regular contractions.  Seen in MAU and originally thought to be 7cm dilated so brought to L&D and I met her there.  Contractions regular and uncomfortable.  Cervix actually 70/3-4/-2 with BBOW .  Prenatal care complicated by low lying placenta which resolved on f/u scan.  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  Cervical lengths followed and remained within normal limits.    Maternal Medical History:  Reason for admission: Contractions.   Contractions: Onset was 3-5 hours ago.   Frequency: regular.   Perceived severity is moderate.    Fetal activity: Perceived fetal activity is normal.    Prenatal Complications - Diabetes: none.    OB History   Grav Para Term Preterm Abortions TAB SAB Ect Mult Living   1              Past Medical History  Diagnosis Date  . Vaginal Pap smear, abnormal   . Cervical cancer 2007    Treated with radiation, laser and cryo  . Asthma     does not use inhaler regularly   Past Surgical History  Procedure Laterality Date  . Leep  2007  . Appendectomy  2007  . Wisdom tooth extraction  2005  . Tonsillectomy    . Wisdom tooth extraction    . Appendectomy  2007   Family History: family history includes Hyperlipidemia in her mother; Hypertension in her mother; Lupus in her mother; Thyroid cancer in her mother. Social History:  reports that she has never smoked. She has never used smokeless tobacco. She reports that she drinks alcohol. She reports that she does not use illicit drugs.   Prenatal Transfer Tool  Maternal Diabetes: No Genetic Screening: Normal Maternal Ultrasounds/Referrals: Normal Fetal Ultrasounds or other Referrals:  None Maternal Substance Abuse:  No Significant Maternal Medications:  None Significant Maternal Lab Results:  None Other Comments:   None  ROS  Dilation: 3.5 Effacement (%): 70 Station: -2 Exam by:: Dr Marvel Plan Blood pressure 132/88, pulse 98, temperature 97.8 F (36.6 C), temperature source Oral, resp. rate 18, height _0  (1.676 m), weight 77.111 kg (170 lb), last menstrual period 09/04/2013. Maternal Exam:  Uterine Assessment: Contraction strength is moderate.  Contraction frequency is regular.   Abdomen: Fetal presentation: vertex  Introitus: Normal vulva. Normal vagina.    Physical Exam  Constitutional: She is oriented to person, place, and time. She appears well-developed and well-nourished.  Cardiovascular: Normal rate and regular rhythm.   Respiratory: Effort normal.  GI: Soft.  Genitourinary: Vagina normal and uterus normal.  Neurological: She is alert and oriented to person, place, and time.  Psychiatric: She has a normal mood and affect.    Prenatal labs: ABO, Rh: O/POS/-- (12/30 0945) Antibody: NEG (12/30 0945) Rubella: 1.11 (12/30 0945) RPR: NON REAC (12/30 0945)  HBsAg: NEGATIVE (12/30 0945)  HIV: NON REACTIVE (12/30 0945)  GBS: Negative (07/29 0000)  One hour GTT 128 Panorama and AFP negative CF negative   Assessment/Plan: Pt admitted to L&D with apparent labor.  Since<39 weeks will confirm cervical change before committing to an epidural.  Given stadol for pain relief and will recheck cervix in next 1-2 hours to confirm change.  She was 1-2 cm on last office visit.   Logan Bores 05/30/2014, 5:45 PM

## 2014-05-30 NOTE — Anesthesia Preprocedure Evaluation (Signed)
Anesthesia Evaluation  Patient identified by MRN, date of birth, ID band Patient awake    Reviewed: Allergy & Precautions, H&P , Patient's Chart, lab work & pertinent test results  Airway Mallampati: II  TM Distance: >3 FB Neck ROM: full    Dental   Pulmonary asthma ,  breath sounds clear to auscultation        Cardiovascular Rhythm:regular Rate:Normal     Neuro/Psych    GI/Hepatic   Endo/Other    Renal/GU      Musculoskeletal   Abdominal   Peds  Hematology   Anesthesia Other Findings   Reproductive/Obstetrics (+) Pregnancy                             Anesthesia Physical Anesthesia Plan  ASA: II  Anesthesia Plan: Epidural   Post-op Pain Management:    Induction:   Airway Management Planned:   Additional Equipment:   Intra-op Plan:   Post-operative Plan:   Informed Consent: I have reviewed the patients History and Physical, chart, labs and discussed the procedure including the risks, benefits and alternatives for the proposed anesthesia with the patient or authorized representative who has indicated his/her understanding and acceptance.     Plan Discussed with:   Anesthesia Plan Comments:         Anesthesia Quick Evaluation  

## 2014-05-30 NOTE — Anesthesia Procedure Notes (Signed)
Epidural Patient location during procedure: OB Start time: 05/30/2014 7:22 PM  Staffing Anesthesiologist: Rudean Curt Performed by: anesthesiologist   Preanesthetic Checklist Completed: patient identified, site marked, surgical consent, pre-op evaluation, timeout performed, IV checked, risks and benefits discussed and monitors and equipment checked  Epidural Patient position: sitting Prep: site prepped and draped and DuraPrep Patient monitoring: continuous pulse ox and blood pressure Approach: midline Location: L3-L4 Injection technique: LOR air  Needle:  Needle type: Tuohy  Needle gauge: 17 G Needle length: 9 cm and 9 Needle insertion depth: 5 cm cm Catheter type: closed end flexible Catheter size: 19 Gauge Catheter at skin depth: 10 cm Test dose: negative  Assessment Events: blood not aspirated, injection not painful, no injection resistance, negative IV test and no paresthesia  Additional Notes Patient identified.  Risk benefits discussed including failed block, incomplete pain control, headache, nerve damage, paralysis, blood pressure changes, nausea, vomiting, reactions to medication both toxic or allergic, and postpartum back pain.  Patient expressed understanding and wished to proceed.  All questions were answered.  Sterile technique used throughout procedure and epidural site dressed with sterile barrier dressing. No paresthesia or other complications noted.The patient did not experience any signs of intravascular injection such as tinnitus or metallic taste in mouth nor signs of intrathecal spread such as rapid motor block. Please see nursing notes for vital signs.

## 2014-05-30 NOTE — MAU Note (Signed)
Pt her for labor check. Having ctx 5-7 min all day got worse around 4pm  Denies andy SROM or bleeding good fetal movement reported.

## 2014-05-30 NOTE — Progress Notes (Signed)
Notified pt in MAU, cervical exam, admission orders obtained, will see pt on Birthing Suites.

## 2014-05-31 ENCOUNTER — Encounter (HOSPITAL_COMMUNITY): Payer: Self-pay | Admitting: *Deleted

## 2014-05-31 LAB — CBC
HCT: 35.5 % — ABNORMAL LOW (ref 36.0–46.0)
Hemoglobin: 12.5 g/dL (ref 12.0–15.0)
MCH: 33.2 pg (ref 26.0–34.0)
MCHC: 35.2 g/dL (ref 30.0–36.0)
MCV: 94.4 fL (ref 78.0–100.0)
PLATELETS: 180 10*3/uL (ref 150–400)
RBC: 3.76 MIL/uL — ABNORMAL LOW (ref 3.87–5.11)
RDW: 13.4 % (ref 11.5–15.5)
WBC: 17.3 10*3/uL — AB (ref 4.0–10.5)

## 2014-05-31 LAB — RPR

## 2014-05-31 MED ORDER — PRENATAL MULTIVITAMIN CH
1.0000 | ORAL_TABLET | Freq: Every day | ORAL | Status: DC
  Administered 2014-05-31 – 2014-06-01 (×2): 1 via ORAL
  Filled 2014-05-31 (×2): qty 1

## 2014-05-31 MED ORDER — IBUPROFEN 600 MG PO TABS
600.0000 mg | ORAL_TABLET | Freq: Four times a day (QID) | ORAL | Status: DC
  Administered 2014-05-31 – 2014-06-01 (×6): 600 mg via ORAL
  Filled 2014-05-31 (×6): qty 1

## 2014-05-31 MED ORDER — ONDANSETRON HCL 4 MG PO TABS: 4.0000 mg | ORAL_TABLET | ORAL | Status: DC | PRN

## 2014-05-31 MED ORDER — TETANUS-DIPHTH-ACELL PERTUSSIS 5-2.5-18.5 LF-MCG/0.5 IM SUSP: 0.5000 mL | Freq: Once | INTRAMUSCULAR | Status: DC

## 2014-05-31 MED ORDER — WITCH HAZEL-GLYCERIN EX PADS: 1.0000 "application " | MEDICATED_PAD | CUTANEOUS | Status: DC | PRN

## 2014-05-31 MED ORDER — ZOLPIDEM TARTRATE 5 MG PO TABS: 5.0000 mg | ORAL_TABLET | Freq: Every evening | ORAL | Status: DC | PRN

## 2014-05-31 MED ORDER — SENNOSIDES-DOCUSATE SODIUM 8.6-50 MG PO TABS
2.0000 | ORAL_TABLET | ORAL | Status: DC
  Administered 2014-06-01: 2 via ORAL
  Filled 2014-05-31: qty 2

## 2014-05-31 MED ORDER — DIPHENHYDRAMINE HCL 25 MG PO CAPS: 25.0000 mg | ORAL_CAPSULE | Freq: Four times a day (QID) | ORAL | Status: DC | PRN

## 2014-05-31 MED ORDER — OXYCODONE-ACETAMINOPHEN 5-325 MG PO TABS
1.0000 | ORAL_TABLET | ORAL | Status: DC | PRN
  Administered 2014-05-31 (×2): 2 via ORAL
  Administered 2014-05-31: 1 via ORAL
  Administered 2014-06-01: 2 via ORAL
  Filled 2014-05-31 (×3): qty 2
  Filled 2014-05-31: qty 1

## 2014-05-31 MED ORDER — DIBUCAINE 1 % RE OINT: 1.0000 "application " | TOPICAL_OINTMENT | RECTAL | Status: DC | PRN

## 2014-05-31 MED ORDER — SIMETHICONE 80 MG PO CHEW: 80.0000 mg | CHEWABLE_TABLET | ORAL | Status: DC | PRN

## 2014-05-31 MED ORDER — ONDANSETRON HCL 4 MG/2ML IJ SOLN: 4.0000 mg | INTRAMUSCULAR | Status: DC | PRN

## 2014-05-31 MED ORDER — LANOLIN HYDROUS EX OINT: TOPICAL_OINTMENT | CUTANEOUS | Status: DC | PRN

## 2014-05-31 MED ORDER — BENZOCAINE-MENTHOL 20-0.5 % EX AERO: 1.0000 "application " | INHALATION_SPRAY | CUTANEOUS | Status: DC | PRN

## 2014-05-31 NOTE — Progress Notes (Signed)
Post Partum Day 1 Subjective: no complaints and tolerating PO  Objective: Blood pressure 113/64, pulse 82, temperature 97.8 F (36.6 C), temperature source Oral, resp. rate 18, height 5\' 6"  (1.676 m), weight 77.111 kg (170 lb), last menstrual period 09/04/2013, SpO2 98.00%, unknown if currently breastfeeding.  Physical Exam:  General: alert and cooperative Lochia: appropriate Uterine Fundus: firm    Recent Labs  05/30/14 1725 05/31/14 0626  HGB 15.6* 12.5  HCT 42.9 35.5*    Assessment/Plan: Plan for d/c tomorrow Breastfeeding     LOS: 1 day   Michelle Copeland W 05/31/2014, 10:38 AM

## 2014-05-31 NOTE — Discharge Summary (Signed)
Obstetric Discharge Summary Reason for Admission: onset of labor Prenatal Procedures: none Intrapartum Procedures: spontaneous vaginal delivery Postpartum Procedures: none Complications-Operative and Postpartum: first degree perineal laceration Hemoglobin  Date Value Ref Range Status  05/31/2014 12.5  12.0 - 15.0 g/dL Final     REPEATED TO VERIFY     DELTA CHECK NOTED  02/29/2012 15.0  12.2 - 16.2 g/dL Final     HCT  Date Value Ref Range Status  05/31/2014 35.5* 36.0 - 46.0 % Final     HCT, POC  Date Value Ref Range Status  02/29/2012 43.8  37.7 - 47.9 % Final    Physical Exam:  General: alert and cooperative Lochia: appropriate Uterine Fundus: firm   Discharge Diagnoses: Term Pregnancy-delivered  Discharge Information: Date: 06/01/2014 Activity: pelvic rest Diet: routine Medications: Ibuprofen Condition: improved Instructions: refer to practice specific booklet Discharge to: home Follow-up Information   Follow up with Logan Bores, MD In 6 weeks. (postpartum)    Specialty:  Obstetrics and Gynecology   Contact information:   [address] [address] [phone]       Newborn Data: Live born female  Birth Weight: 6 lb 4.2 oz (2841 g) APGAR: 8, 9  Home with mother.  Logan Bores 06/01/2014, 9:07 AM

## 2014-05-31 NOTE — Anesthesia Postprocedure Evaluation (Signed)
  Anesthesia Post-op Note  Patient: Michelle Copeland  Procedure(s) Performed: * No procedures listed *  Patient Location: Mother/Baby  Anesthesia Type:Epidural  Level of Consciousness: awake and alert   Airway and Oxygen Therapy: Patient Spontanous Breathing  Post-op Pain: mild  Post-op Assessment: Post-op Vital signs reviewed, No signs of Nausea or vomiting, No headache, No residual numbness and No residual motor weakness  Post-op Vital Signs: Reviewed  Last Vitals:  Filed Vitals:   05/31/14 0500  BP: 113/64  Pulse: 82  Temp: 36.6 C  Resp: 18    Complications: No apparent anesthesia complications

## 2014-05-31 NOTE — Lactation Note (Signed)
This note was copied from the chart of Michelle Janelie Hosterman. Lactation Consultation Note  Patient Name: Michelle Copeland [id number] Date: 05/31/2014 Reason for consult: Initial assessment Baby 19 hours of life. Mom reports that her nipples are a little sore. Assisted mom to latch baby in football position on right breast. Baby initial latch is shallow, but adjusted self with breast compression and took nipple more deeply. Mom reports increased comfort. Baby suckled rhythmically, demonstrated to parents how to tug chin to flange lower lip outward, and baby had a few swallows then fell asleep. Mom return-demonstrated hand expression with colostrum dripping from both nipples. Demonstrated waking techniques and mom able to latch baby deeply at breast. Baby again suckled well with swallows, then fell asleep. Per parents, family has had a lot of visitors today and baby is probably tired from being passed around. Enc mom to offer lots of STS, nurse with cues and at least 8-12 times/24 hours. Discussed cluster-feeding with parents. Mom given [facility] brochure, aware of OP/BFSG and community resources. Enc mom to call for assistance with latching as needed.  Maternal Data Has patient been taught Hand Expression?: Yes Does the patient have breastfeeding experience prior to this delivery?: No  Feeding Feeding Type: Breast Fed Length of feed: 5 min  LATCH Score/Interventions Latch: Repeated attempts needed to sustain latch, nipple held in mouth throughout feeding, stimulation needed to elicit sucking reflex. Intervention(s): Adjust position;Assist with latch;Breast compression  Audible Swallowing: A few with stimulation Intervention(s): Skin to skin  Type of Nipple: Everted at rest and after stimulation  Comfort (Breast/Nipple): Filling, red/small blisters or bruises, mild/mod discomfort  Problem noted: Mild/Moderate discomfort (Baby has had a shallow latch, mom's nipples a little sore and pink/red.)  Hold  (Positioning): Assistance needed to correctly position infant at breast and maintain latch. Intervention(s): Breastfeeding basics reviewed;Support Pillows  LATCH Score: 6  Lactation Tools Discussed/Used     Consult Status Consult Status: Follow-up Date: 06/01/14 Follow-up type: In-patient    Inocente Salles 05/31/2014, 5:04 PM

## 2014-06-01 MED ORDER — OXYCODONE-ACETAMINOPHEN 5-325 MG PO TABS: 1.0000 | ORAL_TABLET | ORAL | Status: DC | PRN

## 2014-06-01 MED ORDER — IBUPROFEN 600 MG PO TABS: 600.0000 mg | ORAL_TABLET | Freq: Four times a day (QID) | ORAL | Status: DC

## 2014-06-01 NOTE — Progress Notes (Addendum)
Post Partum Day 2 Subjective: no complaints and tolerating PO  Objective: Blood pressure 120/68, pulse 63, temperature 98.2 F (36.8 C), temperature source Oral, resp. rate 18, height 5\' 6"  (1.676 m), weight 77.111 kg (170 lb), last menstrual period 09/04/2013, SpO2 98.00%, unknown if currently breastfeeding.  Physical Exam:  General: alert and cooperative Lochia: appropriate Uterine Fundus: firm    Recent Labs  05/30/14 1725 05/31/14 0626  HGB 15.6* 12.5  HCT 42.9 35.5*    Assessment/Plan: Discharge home and Breastfeeding   LOS: 2 days   Yancey Pedley W 06/01/2014, 9:06 AM

## 2014-06-01 NOTE — Lactation Note (Signed)
This note was copied from the chart of Michelle Copeland. Lactation Consultation Note: Follow up visit with mom before DC. Mom complains of sore nipples. Both nipples pink and raw on tips. Assisted mom with manual pump- reviewed setup and cleaning of pieces. Mom easily able to express Colostrum. Assisted mom with deeper latch and she reports that feels better. Reviewed wide open mouth and keeping the baby close to the breast throughout the feeding. Encouraged breast compression while baby is nursing to keep her stimulated. Nursed on right breast for 15 minutes. Latched to left breast and nursed for 5 minutes then off to sleep. Mom reports that breasts feel warm today. Reviewed engorgement prevention and treatment. Comfort gels given with instructions for use. No questions at present. Reviewed OP appointments and BFSG as resources for support after DC. To call prn  Patient Name: Michelle Jezlyn Westerfield [id number] Date: 06/01/2014 Reason for consult: Follow-up assessment   Maternal Data Formula Feeding for Exclusion: No Does the patient have breastfeeding experience prior to this delivery?: No  Feeding Feeding Type: Breast Fed Nipple Type: Slow - flow Length of feed: 20 min  LATCH Score/Interventions Latch: Grasps breast easily, tongue down, lips flanged, rhythmical sucking.  Audible Swallowing: A few with stimulation  Type of Nipple: Everted at rest and after stimulation  Comfort (Breast/Nipple): Filling, red/small blisters or bruises, mild/mod discomfort  Problem noted: Mild/Moderate discomfort Interventions (Mild/moderate discomfort): Comfort gels  Hold (Positioning): Assistance needed to correctly position infant at breast and maintain latch. Intervention(s): Breastfeeding basics reviewed;Position options;Skin to skin  LATCH Score: 7  Lactation Tools Discussed/Used WIC Program: No   Consult Status Consult Status: Complete    Truddie Crumble 06/01/2014, 9:11 AM

## 2014-06-11 ENCOUNTER — Ambulatory Visit (HOSPITAL_COMMUNITY): Payer: BC Managed Care – PPO

## 2014-08-06 IMAGING — US US OB COMP LESS 14 WK
1 series · 14 of 22 positions shown · non-contrast
Comparison: None.

CLINICAL DATA: Unsure of last menstrual.

EXAM:
OBSTETRIC <14 WK ULTRASOUND
TECHNIQUE: Transabdominal ultrasound was performed for evaluation of the
gestation as well as the maternal uterus and adnexal regions.

[Series 1: us ob comp less 14 wks · 14 of 22 slices shown]
[im 1/22]
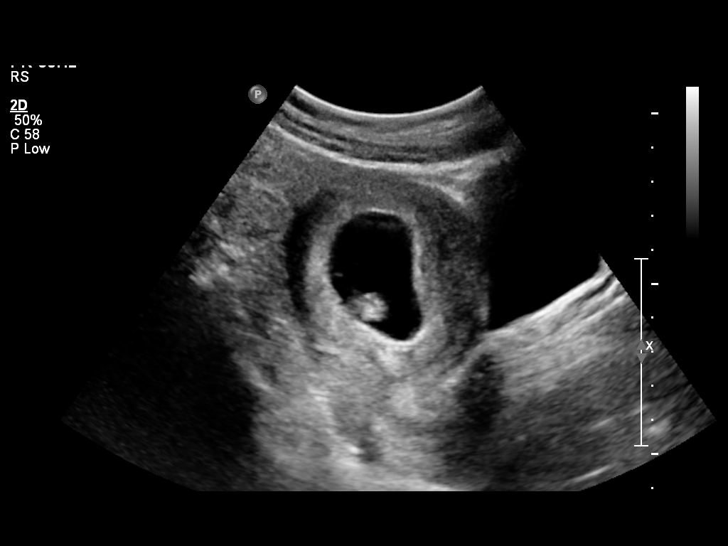
[im 3/22]
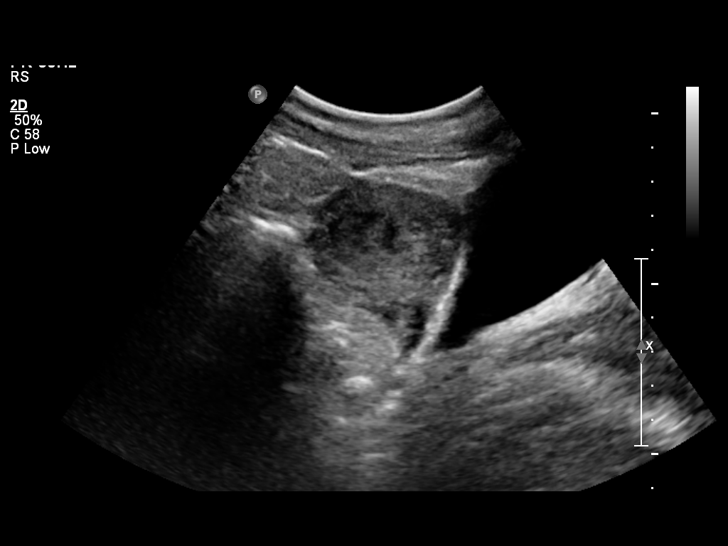
[im 4/22]
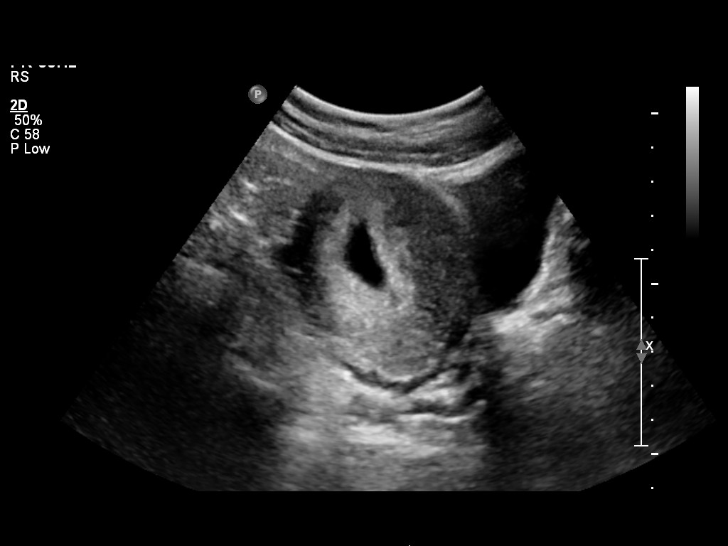
[im 6/22]
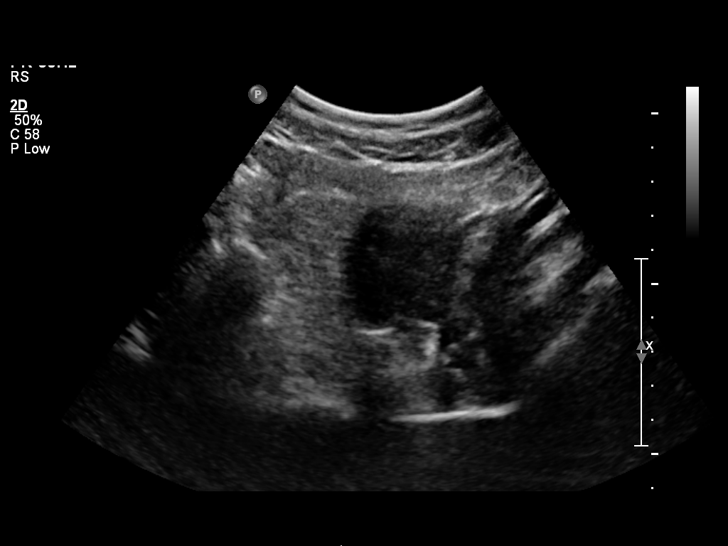
[im 8/22]
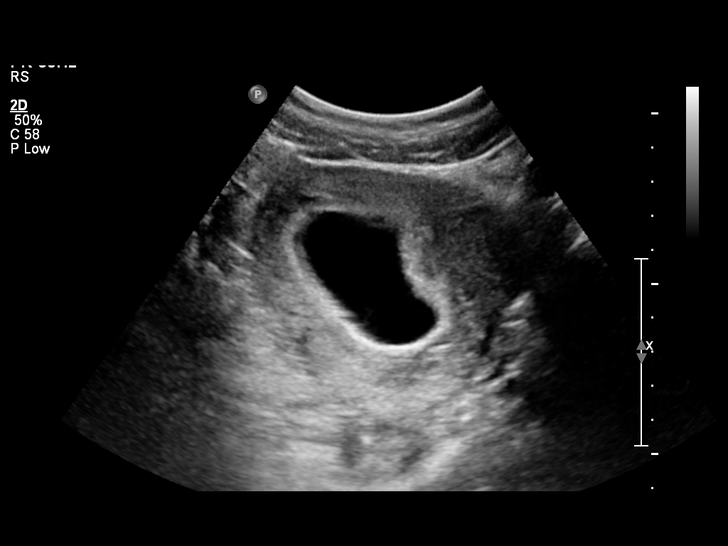
[im 9/22]
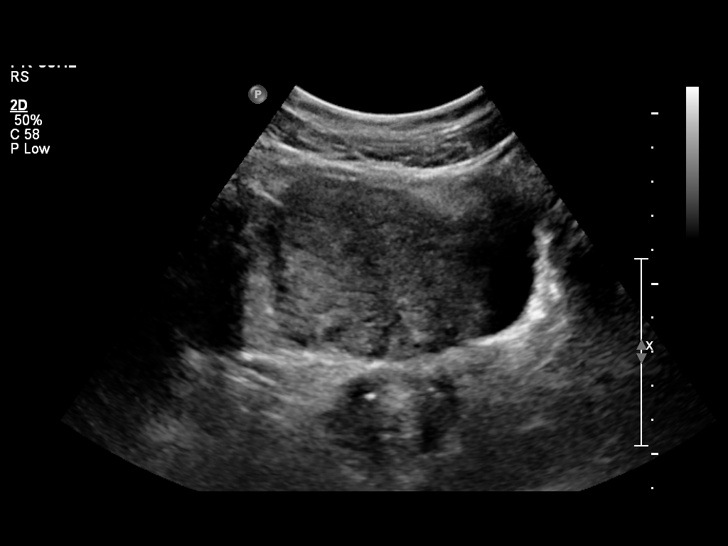
[im 11/22]
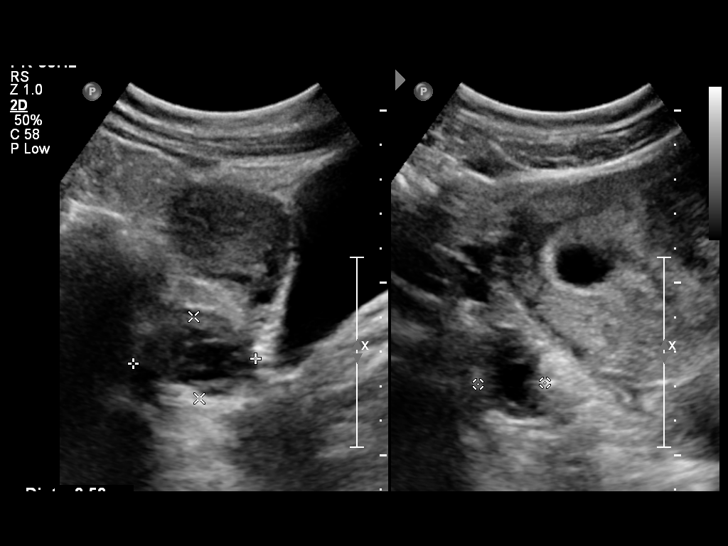
[im 12/22]
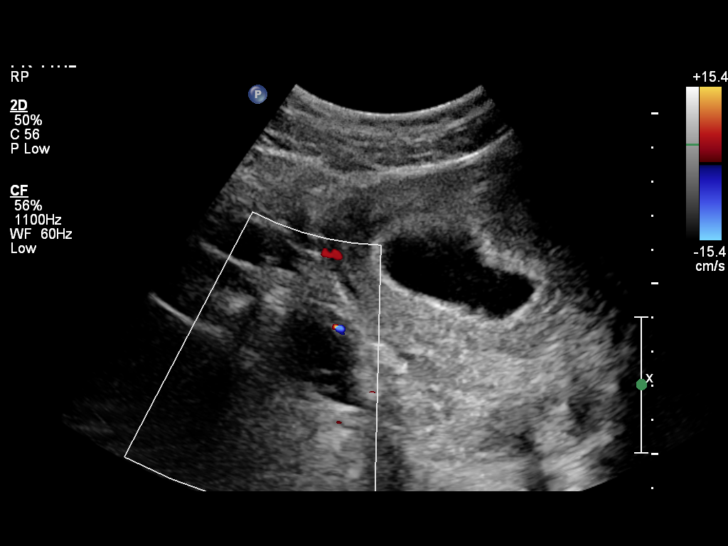
[im 14/22]
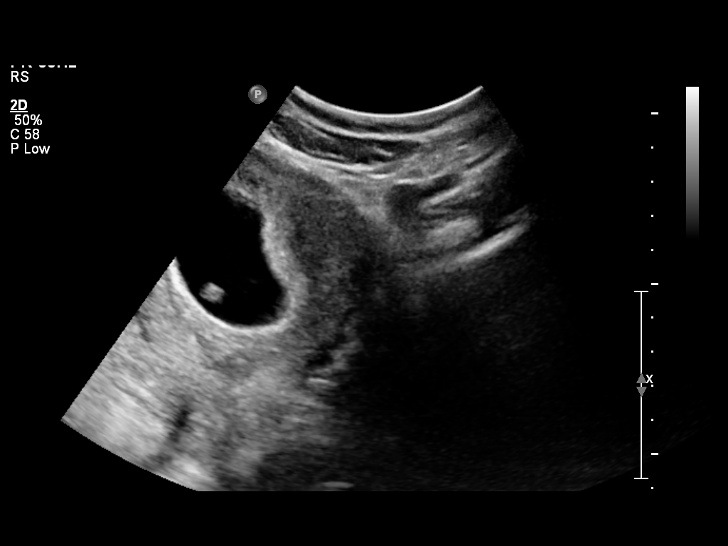
[im 15/22]
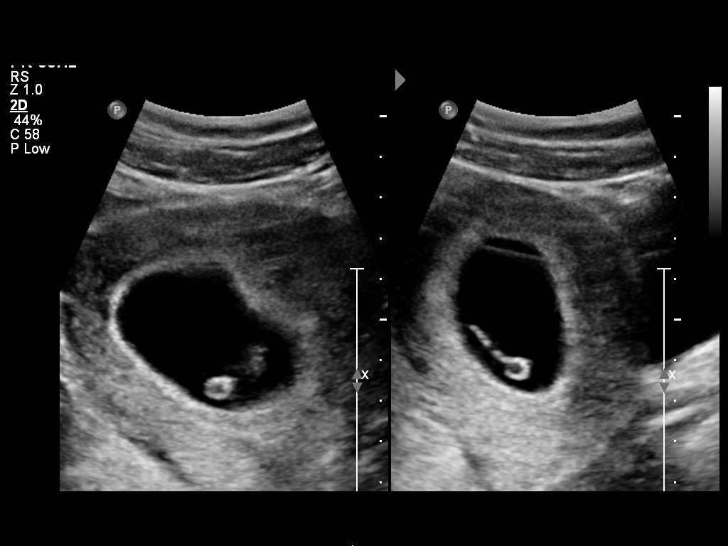
[im 17/22]
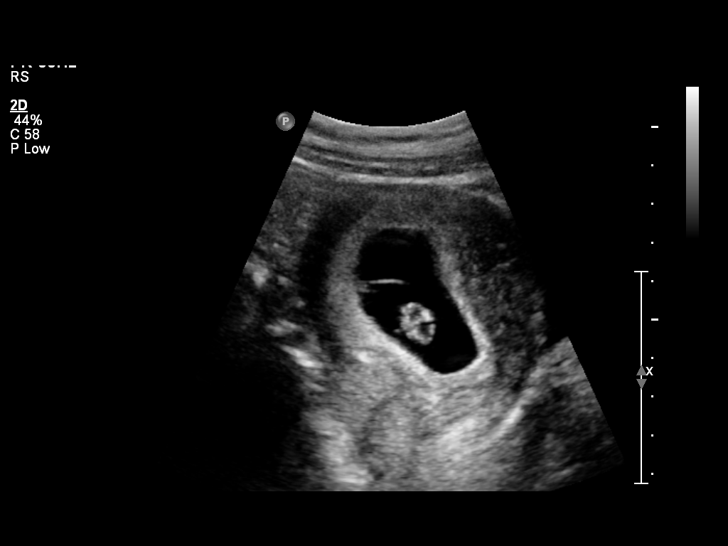
[im 19/22]
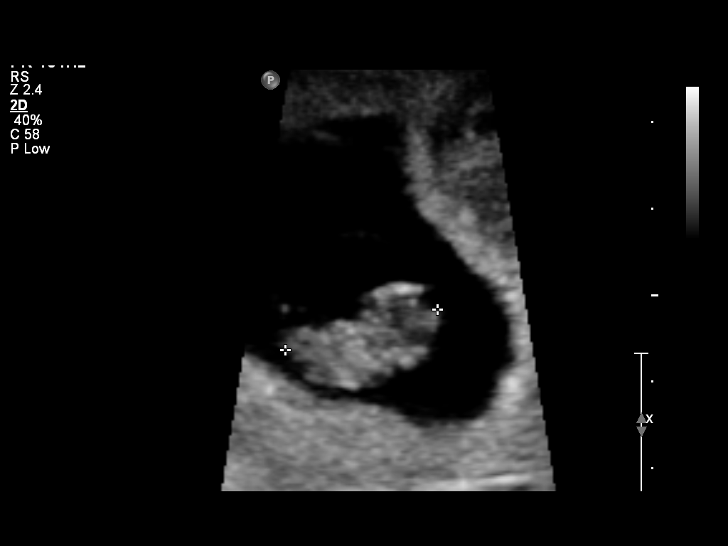
[im 20/22]
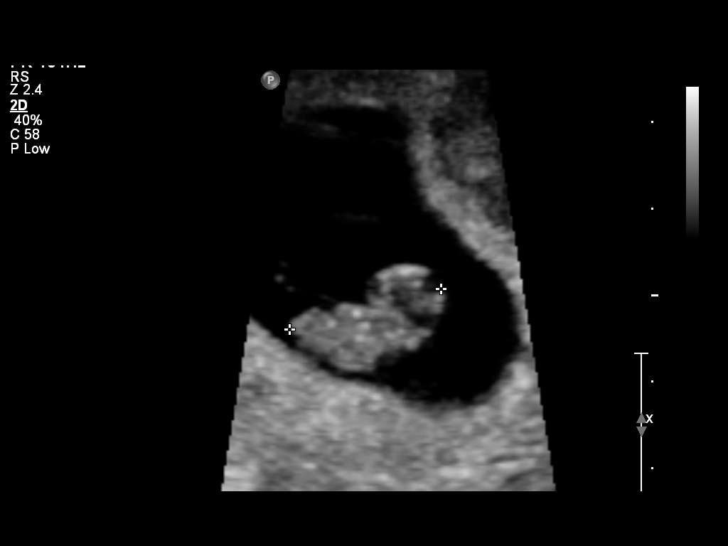
[im 22/22]
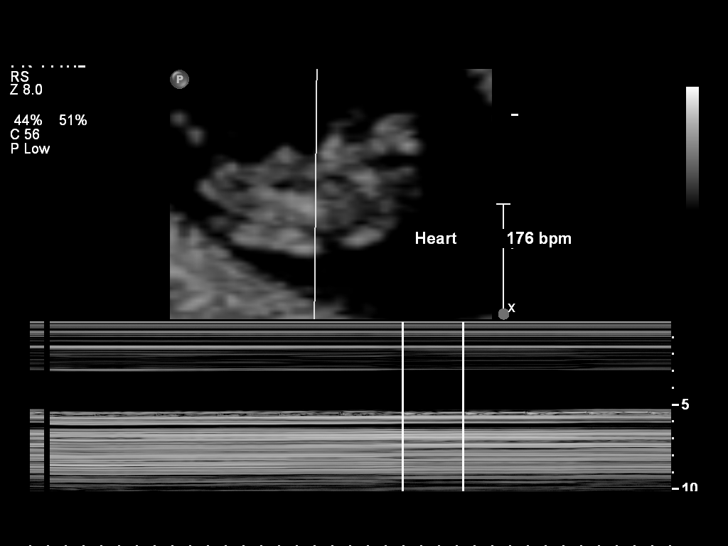

[14 of 22 positions shown; findings below may reference images not displayed]

FINDINGS: Intrauterine gestational sac: Visualized/normal in shape.

Yolk sac:  Present

Embryo:  Present

Cardiac Activity: Present

Heart Rate: 176 bpm

CRL:   1.81  cm   8 w 3 d                  US EDC: 06/13/2014

Maternal uterus/adnexae:

Subchorionic hemorrhage: None

Right ovary: Normal

Left ovary: Not visualized

Other :None

Free fluid:  None
IMPRESSION: 1. Single living intrauterine gestation.
2. Estimated gestational age is 8 weeks and 5 days.

## 2014-08-10 ENCOUNTER — Ambulatory Visit (INDEPENDENT_AMBULATORY_CARE_PROVIDER_SITE_OTHER): Payer: BC Managed Care – PPO | Admitting: Emergency Medicine

## 2014-08-10 VITALS — BP 98/74 | HR 61 | Temp 97.8°F | Resp 18 | Wt 149.0 lb

## 2014-08-10 DIAGNOSIS — L03032 Cellulitis of left toe: Secondary | ICD-10-CM

## 2014-08-10 DIAGNOSIS — L600 Ingrowing nail: Secondary | ICD-10-CM

## 2014-08-10 MED ORDER — SULFAMETHOXAZOLE-TMP DS 800-160 MG PO TABS: 1.0000 | ORAL_TABLET | Freq: Two times a day (BID) | ORAL | Status: DC

## 2014-08-10 MED ORDER — HYDROCODONE-ACETAMINOPHEN 5-325 MG PO TABS: 1.0000 | ORAL_TABLET | ORAL | Status: DC | PRN

## 2014-08-10 NOTE — Progress Notes (Signed)
[facility] [address], [place] [address] [phone]  Date:  08/10/2014   Name:  Michelle Copeland   DOB:  06-27-1986   MRN:  [id number]  PCP:  No primary provider on file.    Chief Complaint: Ingrown Toenail   History of Present Illness:  Michelle Copeland is a 28 y.o. very pleasant female patient who presents with the following:  Stay at home mom with a painful lateral left great toe nail. No fever or chills.  No history of injury. No improvement with over the counter medications or other home remedies.  Denies other complaint or health concern today.   Patient Active Problem List   Diagnosis Date Noted  . NSVD (normal spontaneous vaginal delivery) 05/31/2014  . Labor and delivery indication for care or intervention 05/30/2014  . Supervision of normal first pregnancy in first trimester 11/04/2013  . History of LEEP (loop electrosurgical excision procedure) of cervix complicating pregnancy in first trimester 11/04/2013    Past Medical History  Diagnosis Date  . Vaginal Pap smear, abnormal   . Cervical cancer 2007    Treated with radiation, laser and cryo  . Asthma     does not use inhaler regularly    Past Surgical History  Procedure Laterality Date  . Leep  2007  . Appendectomy  2007  . Wisdom tooth extraction  2005  . Tonsillectomy    . Wisdom tooth extraction    . Appendectomy  2007    History  Substance Use Topics  . Smoking status: Never Smoker   . Smokeless tobacco: Never Used  . Alcohol Use: Yes     Comment: stopped since pregant    Family History  Problem Relation Age of Onset  . Lupus Mother   . Hypertension Mother   . Thyroid cancer Mother   . Hyperlipidemia Mother     Allergies  Allergen Reactions  . Banana Hives  . Codeine Hives    Medication list has been reviewed and updated.  Current Outpatient Prescriptions on File Prior to Visit  Medication Sig Dispense Refill  . Prenatal Vit-Fe Fumarate-FA (PRENATAL  MULTIVITAMIN) TABS tablet Take 1 tablet by mouth daily.      Marland Kitchen ibuprofen (ADVIL,MOTRIN) 600 MG tablet Take 1 tablet (600 mg total) by mouth every 6 (six) hours.  30 tablet  0  . oxyCODONE-acetaminophen (PERCOCET/ROXICET) 5-325 MG per tablet Take 1-2 tablets by mouth every 4 (four) hours as needed for severe pain.  30 tablet  0   No current facility-administered medications on file prior to visit.    Review of Systems:  As per HPI, otherwise negative.    Physical Examination: Filed Vitals:   08/10/14 1831  BP: 98/74  Pulse: 61  Temp: 97.8 F (36.6 C)  Resp: 18   Filed Vitals:   08/10/14 1831  Weight: 149 lb (67.586 kg)   Body mass index is 24.06 kg/(m^2). Ideal Body Weight:     GEN: WDWN, NAD, Non-toxic, Alert & Oriented x 3 HEENT: Atraumatic, Normocephalic.  Ears and Nose: No external deformity. EXTR: No clubbing/cyanosis/edema NEURO: Normal gait.  PSYCH: Normally interactive. Conversant. Not depressed or anxious appearing.  Calm demeanor.  LEFT foot: tender swollen left great toe with paronychia  Assessment and Plan: Paronychia Ingrown nail Septra Hold breast feeding  Signed,  Ellison Carwin, MD

## 2014-08-10 NOTE — Progress Notes (Signed)
Procedure Note Consent obtained. Digital blocked performed with 3 cc 2% lido. Additional 2 cc 2% lido local anesthesia given when digital block failed. 1/3 medial nail removed. Xeroform placed on nailbed. Dressing placed.    Alveta Heimlich, PA-C

## 2014-08-10 NOTE — Patient Instructions (Addendum)
Paronychia Paronychia is an inflammatory reaction involving the folds of the skin surrounding the fingernail. This is commonly caused by an infection in the skin around a nail. The most common cause of paronychia is frequent wetting of the hands (as seen with bartenders, food servers, nurses or others who wet their hands). This makes the skin around the fingernail susceptible to infection by bacteria (germs) or fungus. Other predisposing factors are:  Aggressive manicuring.  Nail biting.  Thumb sucking. The most common cause is a staphylococcal (a type of germ) infection, or a fungal (Candida) infection. When caused by a germ, it usually comes on suddenly with redness, swelling, pus and is often painful. It may get under the nail and form an abscess (collection of pus), or form an abscess around the nail. If the nail itself is infected with a fungus, the treatment is usually prolonged and may require oral medicine for up to one year. Your caregiver will determine the length of time treatment is required. The paronychia caused by bacteria (germs) may largely be avoided by not pulling on hangnails or picking at cuticles. When the infection occurs at the tips of the finger it is called felon. When the cause of paronychia is from the herpes simplex virus (HSV) it is called herpetic whitlow. TREATMENT  When an abscess is present treatment is often incision and drainage. This means that the abscess must be cut open so the pus can get out. When this is done, the following home care instructions should be followed. HOME CARE INSTRUCTIONS   It is important to keep the affected fingers very dry. Rubber or plastic gloves over cotton gloves should be used whenever the hand must be placed in water.  Keep wound clean, dry and dressed as suggested by your caregiver between warm soaks or warm compresses.  Soak in warm water for fifteen to twenty minutes three to four times per day for bacterial infections. Fungal  infections are very difficult to treat, so often require treatment for long periods of time.  For bacterial (germ) infections take antibiotics (medicine which kill germs) as directed and finish the prescription, even if the problem appears to be solved before the medicine is gone.  Only take over-the-counter or prescription medicines for pain, discomfort, or fever as directed by your caregiver. SEEK IMMEDIATE MEDICAL CARE IF:  You have redness, swelling, or increasing pain in the wound.  You notice pus coming from the wound.  You have a fever.  You notice a bad smell coming from the wound or dressing. Document Released: 03/28/2001 Document Revised: 12/25/2011 Document Reviewed: 11/27/2008 [facility] Patient Information 2015 [place], [place]. This information is not intended to replace advice given to you by your health care provider. Make sure you discuss any questions you have with your health care provider.   INGROWN TOENAIL . Keep area clean, dry and bandaged for 24 hours. . After 24 hours, remove outer bandage and leave yellow gauze in place. Domingo Madeira toe/foot in warm soapy water for 5-10 minutes, once daily for 5 days. Rebandage toe after each cleaning. . Continue soaks until yellow gauze falls off. . Notify the office if you experience any of the following signs of infection: Swelling, redness, pus drainage, streaking, fever > 101.0 F

## 2014-11-19 ENCOUNTER — Ambulatory Visit (INDEPENDENT_AMBULATORY_CARE_PROVIDER_SITE_OTHER): Payer: BLUE CROSS/BLUE SHIELD | Admitting: Emergency Medicine

## 2014-11-19 VITALS — BP 122/82 | HR 73 | Temp 97.8°F | Resp 16 | Ht 66.0 in | Wt 143.0 lb

## 2014-11-19 DIAGNOSIS — F32A Depression, unspecified: Secondary | ICD-10-CM

## 2014-11-19 DIAGNOSIS — F329 Major depressive disorder, single episode, unspecified: Secondary | ICD-10-CM

## 2014-11-19 DIAGNOSIS — G43909 Migraine, unspecified, not intractable, without status migrainosus: Secondary | ICD-10-CM

## 2014-11-19 MED ORDER — PAROXETINE HCL 20 MG PO TABS: 20.0000 mg | ORAL_TABLET | Freq: Every day | ORAL | Status: DC

## 2014-11-19 MED ORDER — SUMATRIPTAN SUCCINATE 100 MG PO TABS: 100.0000 mg | ORAL_TABLET | Freq: Once | ORAL | Status: AC

## 2014-11-19 MED ORDER — LORAZEPAM 1 MG PO TABS: 1.0000 mg | ORAL_TABLET | Freq: Three times a day (TID) | ORAL | Status: DC | PRN

## 2014-11-19 NOTE — Progress Notes (Signed)
[facility] [address] [place] [address] [phone]  Date:  11/19/2014   Name:  Michelle Copeland   DOB:  08-31-1986   MRN:  [id number]  PCP:  No primary care provider on file.    Chief Complaint: Migraine and Insomnia   History of Present Illness:  Michelle Copeland is a 29 y.o. very pleasant female patient who presents with the following:  Says she has had difficulty sleeping for the past 6 weeks and has been tearful.  Stopped breast feeding four weeks ago Lies awake at night and worries about things. No thoughts of suicide or harm to others.   History of prior depression responded well to SSRI. Has recurrent "migraines"  With history of prior similar headaches in past satisfactorily treated with imitrex Now has headache since 0400 today.  Awakened her from sleep. No photophobia or vomiting.  Some nausea. No fever or chills.  No antecedent injury to head or illness Says pain arises from back of neck and pass over top of head. No improvement with over the counter medications or other home remedies. . Denies other complaint or health concern today.   Patient Active Problem List   Diagnosis Date Noted  . NSVD (normal spontaneous vaginal delivery) 05/31/2014  . Labor and delivery indication for care or intervention 05/30/2014  . Supervision of normal first pregnancy in first trimester 11/04/2013  . History of LEEP (loop electrosurgical excision procedure) of cervix complicating pregnancy in first trimester 11/04/2013    Past Medical History  Diagnosis Date  . Vaginal Pap smear, abnormal   . Cervical cancer 2007    Treated with radiation, laser and cryo  . Asthma     does not use inhaler regularly    Past Surgical History  Procedure Laterality Date  . Leep  2007  . Appendectomy  2007  . Wisdom tooth extraction  2005  . Tonsillectomy    . Wisdom tooth extraction    . Appendectomy  2007    History  Substance Use Topics  . Smoking status: Never  Smoker   . Smokeless tobacco: Never Used  . Alcohol Use: Yes     Comment: stopped since pregant    Family History  Problem Relation Age of Onset  . Lupus Mother   . Hypertension Mother   . Thyroid cancer Mother   . Hyperlipidemia Mother     Allergies  Allergen Reactions  . Banana Hives  . Codeine Hives    Medication list has been reviewed and updated.  No current outpatient prescriptions on file prior to visit.   No current facility-administered medications on file prior to visit.    Review of Systems:  As per HPI, otherwise negative.    Physical Examination: Filed Vitals:   11/19/14 1528  BP: 122/82  Pulse: 73  Temp: 97.8 F (36.6 C)  Resp: 16   Filed Vitals:   11/19/14 1528  Height: 5\' 6"  (1.676 m)  Weight: 143 lb (64.864 kg)   Body mass index is 23.09 kg/(m^2). Ideal Body Weight: Weight in (lb) to have BMI = 25: 154.6  GEN: WDWN, NAD, Non-toxic, A & O x 3 HEENT: Atraumatic, Normocephalic. Neck supple. No masses, No LAD. Ears and Nose: No external deformity. CV: RRR, No M/G/R. No JVD. No thrill. No extra heart sounds. PULM: CTA B, no wheezes, crackles, rhonchi. No retractions. No resp. distress. No accessory muscle use. ABD: S, NT, ND, +BS. No rebound. No HSM. EXTR: No c/c/e  NEURO Normal gait.  PRRERLA EOMI gross motor and cerebellar intact CN 2-12 intact PSYCH: Normally interactive. Conversant. Not depressed or anxious appearing.  Tearful with flat affect   Assessment and Plan: Tension headache Depression paxil Ativan imitrex  Signed,  Ellison Carwin, MD

## 2014-11-19 NOTE — Patient Instructions (Signed)
Abdominal Migraine Abdominal migraine is one of several types of migraine. It is one example of "periodic syndrome". The periodic type more commonly occurs in children. Such children usually have a family history of migraine. Children may go on to develop typical migraines later in their lives.  SYMPTOMS  The attacks usually include intermittent periods of abdominal pain. Along with the abdominal pain, other symptoms may occur such as:  Nausea.  Vomiting.  Intense blushing or reddening of the skin (flushing).  Pale appearance to the skin (pallor). DIAGNOSIS  Tests may be done to look for other possible conditions. TREATMENT  Medications that are useful in treating migraine may also work to control these attacks in most children. Document Released: 12/23/2003 Document Revised: 01/27/2013 Document Reviewed: 05/20/2008 [facility] Patient Information 2015 [place], [place]. This information is not intended to replace advice given to you by your health care provider. Make sure you discuss any questions you have with your health care provider.

## 2015-10-01 LAB — OB RESULTS CONSOLE ANTIBODY SCREEN: ANTIBODY SCREEN: NEGATIVE

## 2015-10-01 LAB — OB RESULTS CONSOLE RUBELLA ANTIBODY, IGM: Rubella: IMMUNE

## 2015-10-01 LAB — OB RESULTS CONSOLE GC/CHLAMYDIA
Chlamydia: NEGATIVE
Gonorrhea: NEGATIVE

## 2015-10-01 LAB — OB RESULTS CONSOLE HEPATITIS B SURFACE ANTIGEN: Hepatitis B Surface Ag: NEGATIVE

## 2015-10-01 LAB — OB RESULTS CONSOLE ABO/RH: RH TYPE: POSITIVE

## 2015-10-01 LAB — OB RESULTS CONSOLE HIV ANTIBODY (ROUTINE TESTING): HIV: NONREACTIVE

## 2015-10-01 LAB — OB RESULTS CONSOLE RPR: RPR: NONREACTIVE

## 2015-10-17 NOTE — L&D Delivery Note (Signed)
Delivery Note Called to attend as stand-by birth for Dr.Eura Mccauslin who is on her way. Pt progressed rapidly from 9 to 10 w/in 35mins w/ strong/involuntary urge to push, and at 12:10 AM a viable female was delivered via Vaginal, Spontaneous Delivery (Presentation: Left Occiput Anterior).  Vigorous infant placed directly on mom's abdomen for bonding/skin-to-skin. Delayed cord clamping, then cord clamped x 2, and cut by fob.  APGAR: see delivery summary; weight: pending at time of note  .   Placenta status: delivered by Dr. Terri Piedra.  Cord: 3 vessels with the following complications: Loose true knot.  Cord pH: n/a  Anesthesia: Epidural  Episiotomy: None Lacerations:  none Suture Repair: n/a Est. Blood Loss (mL):  Minimal, refer to delivery note/Dr. Ivor Costa note for final count  Mom to postpartum.  Baby to Couplet care / Skin to Skin.  Tawnya Crook 04/09/2016, 12:22 AM   Agree with above

## 2016-04-08 ENCOUNTER — Inpatient Hospital Stay (HOSPITAL_COMMUNITY): Payer: BLUE CROSS/BLUE SHIELD | Admitting: Anesthesiology

## 2016-04-08 ENCOUNTER — Encounter (HOSPITAL_COMMUNITY): Payer: Self-pay | Admitting: Certified Nurse Midwife

## 2016-04-08 ENCOUNTER — Inpatient Hospital Stay (HOSPITAL_COMMUNITY)
Admission: AD | Admit: 2016-04-08 | Discharge: 2016-04-10 | DRG: 775 | Disposition: A | Discharge status: Home / Self Care | Payer: BLUE CROSS/BLUE SHIELD | Source: Ambulatory Visit | Attending: Obstetrics and Gynecology | Admitting: Obstetrics and Gynecology

## 2016-04-08 DIAGNOSIS — O9952 Diseases of the respiratory system complicating childbirth: Secondary | ICD-10-CM | POA: Diagnosis present

## 2016-04-08 DIAGNOSIS — Z923 Personal history of irradiation: Secondary | ICD-10-CM | POA: Diagnosis not present

## 2016-04-08 DIAGNOSIS — J45909 Unspecified asthma, uncomplicated: Secondary | ICD-10-CM | POA: Diagnosis present

## 2016-04-08 DIAGNOSIS — Z3A37 37 weeks gestation of pregnancy: Secondary | ICD-10-CM

## 2016-04-08 DIAGNOSIS — Z808 Family history of malignant neoplasm of other organs or systems: Secondary | ICD-10-CM | POA: Diagnosis not present

## 2016-04-08 DIAGNOSIS — Z8541 Personal history of malignant neoplasm of cervix uteri: Secondary | ICD-10-CM | POA: Diagnosis not present

## 2016-04-08 DIAGNOSIS — Z8249 Family history of ischemic heart disease and other diseases of the circulatory system: Secondary | ICD-10-CM

## 2016-04-08 DIAGNOSIS — IMO0001 Reserved for inherently not codable concepts without codable children: Secondary | ICD-10-CM

## 2016-04-08 HISTORY — DX: Personal history of malignant neoplasm of cervix uteri: Z85.41

## 2016-04-08 LAB — CBC
HEMATOCRIT: 40.4 % (ref 36.0–46.0)
HEMOGLOBIN: 14.4 g/dL (ref 12.0–15.0)
MCH: 32.4 pg (ref 26.0–34.0)
MCHC: 35.6 g/dL (ref 30.0–36.0)
MCV: 90.8 fL (ref 78.0–100.0)
Platelets: 203 10*3/uL (ref 150–400)
RBC: 4.45 MIL/uL (ref 3.87–5.11)
RDW: 13.1 % (ref 11.5–15.5)
WBC: 11.5 10*3/uL — AB (ref 4.0–10.5)

## 2016-04-08 LAB — TYPE AND SCREEN
ABO/RH(D): O POS
ANTIBODY SCREEN: NEGATIVE

## 2016-04-08 LAB — OB RESULTS CONSOLE GBS: STREP GROUP B AG: NEGATIVE

## 2016-04-08 MED ORDER — OXYTOCIN BOLUS FROM INFUSION: 500.0000 mL | INTRAVENOUS | Status: DC

## 2016-04-08 MED ORDER — PHENYLEPHRINE 40 MCG/ML (10ML) SYRINGE FOR IV PUSH (FOR BLOOD PRESSURE SUPPORT)
80.0000 ug | PREFILLED_SYRINGE | INTRAVENOUS | Status: DC | PRN
  Filled 2016-04-08: qty 5
  Filled 2016-04-08: qty 10

## 2016-04-08 MED ORDER — PHENYLEPHRINE 40 MCG/ML (10ML) SYRINGE FOR IV PUSH (FOR BLOOD PRESSURE SUPPORT)
80.0000 ug | PREFILLED_SYRINGE | INTRAVENOUS | Status: DC | PRN
  Filled 2016-04-08: qty 5

## 2016-04-08 MED ORDER — TERBUTALINE SULFATE 1 MG/ML IJ SOLN
0.2500 mg | Freq: Once | INTRAMUSCULAR | Status: DC | PRN
  Filled 2016-04-08: qty 1

## 2016-04-08 MED ORDER — LIDOCAINE HCL (PF) 1 % IJ SOLN
INTRAMUSCULAR | Status: DC | PRN
  Administered 2016-04-08: 4 mL
  Administered 2016-04-08: 6 mL via EPIDURAL

## 2016-04-08 MED ORDER — LACTATED RINGERS IV SOLN: INTRAVENOUS | Status: DC

## 2016-04-08 MED ORDER — OXYCODONE-ACETAMINOPHEN 5-325 MG PO TABS: 1.0000 | ORAL_TABLET | ORAL | Status: DC | PRN

## 2016-04-08 MED ORDER — LACTATED RINGERS IV SOLN: 500.0000 mL | Freq: Once | INTRAVENOUS | Status: DC

## 2016-04-08 MED ORDER — OXYCODONE-ACETAMINOPHEN 5-325 MG PO TABS: 2.0000 | ORAL_TABLET | ORAL | Status: DC | PRN

## 2016-04-08 MED ORDER — ACETAMINOPHEN 325 MG PO TABS: 650.0000 mg | ORAL_TABLET | ORAL | Status: DC | PRN

## 2016-04-08 MED ORDER — EPHEDRINE 5 MG/ML INJ
10.0000 mg | INTRAVENOUS | Status: DC | PRN
  Filled 2016-04-08: qty 2

## 2016-04-08 MED ORDER — LIDOCAINE HCL (PF) 1 % IJ SOLN
30.0000 mL | INTRAMUSCULAR | Status: DC | PRN
  Filled 2016-04-08: qty 30

## 2016-04-08 MED ORDER — FENTANYL 2.5 MCG/ML BUPIVACAINE 1/10 % EPIDURAL INFUSION (WH - ANES)
14.0000 mL/h | INTRAMUSCULAR | Status: DC | PRN
  Administered 2016-04-08: 14 mL/h via EPIDURAL
  Filled 2016-04-08: qty 125

## 2016-04-08 MED ORDER — OXYTOCIN 40 UNITS IN LACTATED RINGERS INFUSION - SIMPLE MED
2.5000 [IU]/h | INTRAVENOUS | Status: DC
  Filled 2016-04-08: qty 1000

## 2016-04-08 MED ORDER — LACTATED RINGERS IV SOLN: 500.0000 mL | INTRAVENOUS | Status: DC | PRN

## 2016-04-08 MED ORDER — DIPHENHYDRAMINE HCL 50 MG/ML IJ SOLN: 12.5000 mg | INTRAMUSCULAR | Status: DC | PRN

## 2016-04-08 MED ORDER — ONDANSETRON HCL 4 MG/2ML IJ SOLN: 4.0000 mg | Freq: Four times a day (QID) | INTRAMUSCULAR | Status: DC | PRN

## 2016-04-08 MED ORDER — SOD CITRATE-CITRIC ACID 500-334 MG/5ML PO SOLN: 30.0000 mL | ORAL | Status: DC | PRN

## 2016-04-08 NOTE — H&P (Signed)
Michelle Copeland is a 30 y.o. female presenting for painful regular contractions. Pt has had intermittent contractions all through third triester. She has been 2cm dilated since [redacted]weeks ga. This am pt states contractions started around 3am - they were irregular and not painful. Around 1500 hrs they begun coming every 44mins. She denies any LOF or VB. On exam pt is 4cm dilated.  Her prenatal course has been begun in first trimester and dating confirmed by first trimester u/s. Is on effexor for anxiety.  Maternal Medical History:  Reason for admission: Contractions.  Nausea.  Contractions: Onset was 6-12 hours ago.   Frequency: regular.   Perceived severity is strong.    Fetal activity: Perceived fetal activity is normal.   Last perceived fetal movement was within the past hour.    Prenatal complications: no prenatal complications Prenatal Complications - Diabetes: none.    OB History    Gravida Para Term Preterm AB TAB SAB Ectopic Multiple Living   2 1 1       1      Past Medical History  Diagnosis Date  . Vaginal Pap smear, abnormal   . Cervical cancer ([place]) 2007    Treated with radiation, laser and cryo  . Asthma     does not use inhaler regularly   Past Surgical History  Procedure Laterality Date  . Leep  2007  . Appendectomy  2007  . Wisdom tooth extraction  2005  . Tonsillectomy    . Wisdom tooth extraction    . Appendectomy  2007   Family History: family history includes Hyperlipidemia in her mother; Hypertension in her mother; Lupus in her mother; Thyroid cancer in her mother. Social History:  reports that she has never smoked. She has never used smokeless tobacco. She reports that she does not drink alcohol or use illicit drugs.   Prenatal Transfer Tool  Maternal Diabetes: No Genetic Screening: Normal Maternal Ultrasounds/Referrals: Normal Fetal Ultrasounds or other Referrals:  None Maternal Substance Abuse:  No Significant Maternal Medications:  Meds include:  Other: effexor Significant Maternal Lab Results:  Lab values include: Group B Strep negative Other Comments:  None  Review of Systems  Constitutional: Positive for malaise/fatigue. Negative for fever, chills and weight loss.  Eyes: Negative for blurred vision.  Respiratory: Negative for shortness of breath.   Cardiovascular: Negative for chest pain.  Gastrointestinal: Positive for abdominal pain. Negative for heartburn, nausea and vomiting.  Genitourinary: Negative for dysuria.  Musculoskeletal: Negative for back pain.  Skin: Negative for itching and rash.  Neurological: Negative for dizziness and headaches.  Psychiatric/Behavioral: Negative for depression, suicidal ideas, hallucinations and substance abuse. The patient is nervous/anxious.     Dilation: 3 Effacement (%): 70 Station: -3 Exam by:: A Jones RN Blood pressure 142/78, pulse 86, temperature 97.8 F (36.6 C), temperature source Oral, resp. rate 22, not currently breastfeeding. Maternal Exam:  Uterine Assessment: Contraction strength is moderate.  Contraction frequency is irregular.   Abdomen: Patient reports generalized tenderness.  Estimated fetal weight is AGA.   Fetal presentation: vertex  Introitus: Normal vulva. Normal vagina.  Pelvis: adequate for delivery.   Cervix: Cervix evaluated by digital exam.     Physical Exam  Constitutional: She is oriented to person, place, and time. She appears well-developed.  Neck: Normal range of motion.  Cardiovascular: Normal rate.   Respiratory: Effort normal.  GI: Soft. There is generalized tenderness.  Genitourinary: Vagina normal and uterus normal.  Musculoskeletal: Normal range of motion.  Neurological: She is  alert and oriented to person, place, and time.  Skin: Skin is warm.  Psychiatric: She has a normal mood and affect. Her behavior is normal. Judgment and thought content normal.    Prenatal labs: ABO, Rh:   Antibody:   Rubella:   RPR:    HBsAg:    HIV:     GBS:     Assessment/Plan: G2P1001 at [redacted] wks ga with cervical change.  Admit for labor management GBS neg Augment/AROM prn Anticipate svd Pain control prn   Bonnee Quin Bang[place] 04/08/2016, 7:31 PM

## 2016-04-08 NOTE — Progress Notes (Signed)
Patient ID: Michelle Copeland, female   DOB: 1986-07-28, 30 y.o.   MRN: [id number]  Pt is comfortable with epidural Cervix unchanged at 4cm/80/-2 EFM- 130, moderate variability, cat 1 TOCO - contractions q 2-108mins  P: AROM performed with moderate return of clear fluid      Will augment with pitocin       Anticipate svd

## 2016-04-08 NOTE — Anesthesia Procedure Notes (Signed)

## 2016-04-08 NOTE — Anesthesia Preprocedure Evaluation (Signed)

## 2016-04-08 NOTE — MAU Note (Signed)
Pt states ctxs started at 4AM and are now 2-3 minutes apart. Pt denies vaginal bleeding or LOF. Fetus active.

## 2016-04-09 ENCOUNTER — Encounter (HOSPITAL_COMMUNITY): Payer: Self-pay | Admitting: Obstetrics and Gynecology

## 2016-04-09 LAB — CBC
HEMATOCRIT: 35.5 % — AB (ref 36.0–46.0)
HEMOGLOBIN: 12.3 g/dL (ref 12.0–15.0)
MCH: 31.5 pg (ref 26.0–34.0)
MCHC: 34.6 g/dL (ref 30.0–36.0)
MCV: 91 fL (ref 78.0–100.0)
Platelets: 175 10*3/uL (ref 150–400)
RBC: 3.9 MIL/uL (ref 3.87–5.11)
RDW: 13.1 % (ref 11.5–15.5)
WBC: 10.6 10*3/uL — AB (ref 4.0–10.5)

## 2016-04-09 LAB — ABO/RH: ABO/RH(D): O POS

## 2016-04-09 LAB — RPR: RPR Ser Ql: NONREACTIVE

## 2016-04-09 MED ORDER — IBUPROFEN 600 MG PO TABS
600.0000 mg | ORAL_TABLET | Freq: Four times a day (QID) | ORAL | Status: DC
  Administered 2016-04-09 – 2016-04-10 (×7): 600 mg via ORAL
  Filled 2016-04-09 (×7): qty 1

## 2016-04-09 MED ORDER — VENLAFAXINE HCL ER 37.5 MG PO CP24
37.5000 mg | ORAL_CAPSULE | Freq: Every day | ORAL | Status: DC
  Administered 2016-04-09 – 2016-04-10 (×2): 37.5 mg via ORAL
  Filled 2016-04-09 (×3): qty 1

## 2016-04-09 MED ORDER — ONDANSETRON HCL 4 MG/2ML IJ SOLN: 4.0000 mg | INTRAMUSCULAR | Status: DC | PRN

## 2016-04-09 MED ORDER — DIBUCAINE 1 % RE OINT: 1.0000 "application " | TOPICAL_OINTMENT | RECTAL | Status: DC | PRN

## 2016-04-09 MED ORDER — WITCH HAZEL-GLYCERIN EX PADS: 1.0000 "application " | MEDICATED_PAD | CUTANEOUS | Status: DC | PRN

## 2016-04-09 MED ORDER — COCONUT OIL OIL: 1.0000 "application " | TOPICAL_OIL | Status: DC | PRN

## 2016-04-09 MED ORDER — ONDANSETRON HCL 4 MG PO TABS: 4.0000 mg | ORAL_TABLET | ORAL | Status: DC | PRN

## 2016-04-09 MED ORDER — BENZOCAINE-MENTHOL 20-0.5 % EX AERO: 1.0000 "application " | INHALATION_SPRAY | CUTANEOUS | Status: DC | PRN

## 2016-04-09 MED ORDER — LACTATED RINGERS IV SOLN: INTRAVENOUS | Status: DC

## 2016-04-09 MED ORDER — PRENATAL MULTIVITAMIN CH
1.0000 | ORAL_TABLET | Freq: Every day | ORAL | Status: DC
  Administered 2016-04-09 – 2016-04-10 (×2): 1 via ORAL
  Filled 2016-04-09 (×2): qty 1

## 2016-04-09 MED ORDER — SENNOSIDES-DOCUSATE SODIUM 8.6-50 MG PO TABS
2.0000 | ORAL_TABLET | ORAL | Status: DC
  Filled 2016-04-09: qty 2

## 2016-04-09 MED ORDER — SIMETHICONE 80 MG PO CHEW
80.0000 mg | CHEWABLE_TABLET | ORAL | Status: DC | PRN
  Filled 2016-04-09: qty 1

## 2016-04-09 MED ORDER — ACETAMINOPHEN 325 MG PO TABS
650.0000 mg | ORAL_TABLET | ORAL | Status: DC | PRN
  Administered 2016-04-09 (×2): 650 mg via ORAL
  Filled 2016-04-09 (×2): qty 2

## 2016-04-09 MED ORDER — ZOLPIDEM TARTRATE 5 MG PO TABS: 5.0000 mg | ORAL_TABLET | Freq: Every evening | ORAL | Status: DC | PRN

## 2016-04-09 MED ORDER — TETANUS-DIPHTH-ACELL PERTUSSIS 5-2.5-18.5 LF-MCG/0.5 IM SUSP: 0.5000 mL | Freq: Once | INTRAMUSCULAR | Status: DC

## 2016-04-09 MED ORDER — DIPHENHYDRAMINE HCL 25 MG PO CAPS
25.0000 mg | ORAL_CAPSULE | Freq: Four times a day (QID) | ORAL | Status: DC | PRN
  Administered 2016-04-09: 25 mg via ORAL
  Filled 2016-04-09: qty 1

## 2016-04-09 MED ORDER — OXYCODONE-ACETAMINOPHEN 5-325 MG PO TABS
1.0000 | ORAL_TABLET | ORAL | Status: DC | PRN
  Administered 2016-04-09 – 2016-04-10 (×4): 1 via ORAL
  Filled 2016-04-09 (×4): qty 1

## 2016-04-09 NOTE — Progress Notes (Signed)
CSW received referral for hx of anxiety.  Pt confirms that her MD is medicating her for her anxiety and she does not receive psychotherapy.  PPD discussed.  Pt agreeable to follow up with MD should she notice any changes in mood/behavior at d/c.  Pt describes supportive partner/family and does not identify any other social work needs.  CSW signing off.  Creta Levin, LCSW Weekend Coverage [phone]

## 2016-04-09 NOTE — Anesthesia Postprocedure Evaluation (Signed)
Anesthesia Post Note  Patient: Michelle Copeland  Procedure(s) Performed: * No procedures listed *  Patient location during evaluation: Mother Baby Anesthesia Type: Epidural Level of consciousness: awake and alert, oriented and patient cooperative Pain management: pain level controlled Vital Signs Assessment: post-procedure vital signs reviewed and stable Respiratory status: spontaneous breathing Cardiovascular status: stable Postop Assessment: no headache, epidural receding, patient able to bend at knees and no signs of nausea or vomiting Anesthetic complications: no Comments: Pain score of 4. Received ibuprofen immediately prior to interview.      Last Vitals:  Filed Vitals:   04/09/16 0200 04/09/16 0300  BP: 125/64 113/57  Pulse: 81 90  Temp: 37.2 C 36.6 C  Resp: 18 18    Last Pain:  Filed Vitals:   04/09/16 0408  PainSc: 3    Pain Goal: Patients Stated Pain Goal: 3 (04/09/16 0310)               Rico Sheehan

## 2016-04-09 NOTE — Progress Notes (Signed)
Post Partum Day 0 Subjective: voiding, tolerating PO, + flatus and c/o cramping pain unrelieved with ibuprofen or tylenol. Breastfeeding and bonding well with baby. lochia mild. No fever, chills, HA, CP or SOB  Objective: Blood pressure 114/62, pulse 67, temperature 97.8 F (36.6 C), temperature source Oral, resp. rate 18, height 5\' 6"  (1.676 m), weight 191 lb (86.637 kg), SpO2 99 %, unknown if currently breastfeeding.  Physical Exam:  General: alert, cooperative and no distress Lochia: appropriate Uterine Fundus: firm DVT Evaluation: No evidence of DVT seen on physical exam. No significant calf/ankle edema.   Recent Labs  04/08/16 1955 04/09/16 0543  HGB 14.4 12.3  HCT 40.4 35.5*    Assessment/Plan: Breastfeeding  Will give percocet - pt states able tolerate Routine pp care   LOS: 1 day   [place] 04/09/2016, 11:26 AM

## 2016-04-09 NOTE — Discharge Summary (Signed)
OB Discharge Summary     Patient Name: Michelle Copeland DOB: [birth year] MRN: [id number]  Date of admission: 04/08/2016 Delivering MD: Wells Guiles R   Date of discharge: 04/09/2016  Admitting diagnosis: 100 WKS, LABOR Intrauterine pregnancy: [redacted]w[redacted]d     Secondary diagnosis:  Active Problems:   Active labor at term   SVD (spontaneous vaginal delivery)   Postpartum care following vaginal delivery  Additional problems: none     Discharge diagnosis: Term Pregnancy Delivered                                                                                                Post partum procedures:none  Augmentation: AROM and Pitocin  Complications: None  Hospital course:  Onset of Labor With Vaginal Delivery     30 y.o. yo [id number] at [redacted]w[redacted]d was admitted in Latent Labor on 04/08/2016. Patient had an uncomplicated labor course as follows:  Membrane Rupture Time/Date: 10:20 PM ,04/08/2016   Intrapartum Procedures: Episiotomy: None [1]                                         Lacerations:  None [1]  Patient had a delivery of a Viable infant. 04/09/2016  Information for the patient's newborn:  [place], [place] Girl Eathel [id number]  Delivery Method: Vaginal, Spontaneous Delivery (Filed from Delivery Summary)    Pateint had an uncomplicated postpartum course.  She is ambulating, tolerating a regular diet, passing flatus, and urinating well. Patient is discharged home in stable condition on 04/09/2016.    Physical exam  Filed Vitals:   04/09/16 0115 04/09/16 0200 04/09/16 0300 04/09/16 0816  BP: 125/61 125/64 113/57 114/62  Pulse: 75 81 90 67  Temp:  98.9 F (37.2 C) 97.8 F (36.6 C) 97.8 F (36.6 C)  TempSrc:  Oral Oral Oral  Resp:  18 18 18   Height:      Weight:      SpO2:  100% 99%    General: alert, cooperative and no distress Lochia: appropriate Uterine Fundus: firm Incision: N/A DVT Evaluation: No evidence of DVT seen on physical exam. No significant calf/ankle edema. Labs: Lab  Results  Component Value Date   WBC 10.6* 04/09/2016   HGB 12.3 04/09/2016   HCT 35.5* 04/09/2016   MCV 91.0 04/09/2016   PLT 175 04/09/2016   No flowsheet data found.  Discharge instruction: per After Visit Summary and "Baby and Me Booklet".  After visit meds:    Medication List    ASK your doctor about these medications        DHA PO  Take 1 tablet by mouth daily.     LORazepam 1 MG tablet  Commonly known as:  ATIVAN  Take 1 tablet (1 mg total) by mouth every 8 (eight) hours as needed for anxiety.     ondansetron 4 MG disintegrating tablet  Commonly known as:  ZOFRAN-ODT  Take 4 mg by mouth every 8 (eight) hours as needed for nausea or vomiting. Reported on 04/08/2016  PARoxetine 20 MG tablet  Commonly known as:  PAXIL  Take 1 tablet (20 mg total) by mouth daily.     prenatal multivitamin Tabs tablet  Take 1 tablet by mouth daily at 12 noon.     SUMAtriptan 100 MG tablet  Commonly known as:  IMITREX  Take 1 tablet (100 mg total) by mouth once. May repeat in 2 hours if headache persists or recurs.     venlafaxine XR 37.5 MG 24 hr capsule  Commonly known as:  EFFEXOR-XR  Take 37.5 mg by mouth daily.        Diet: routine diet  Activity: Advance as tolerated. Pelvic rest for 6 weeks.   Outpatient follow up:6 weeks Follow up Appt:No future appointments. Follow up Visit:No Follow-up on file.  Postpartum contraception: Undecided  Newborn Data: Live born female  Birth Weight: 6 lb 7.4 oz (2931 g) APGAR: 8, 9  Baby Feeding: Breast Disposition:home with mother   04/09/2016 [place], DO

## 2016-04-09 NOTE — Lactation Note (Signed)
This note was copied from a baby's chart. Lactation Consultation Note  Patient Name: Michelle Copeland 1st visit.  Today's Date: 04/09/2016 - per mom 1st baby breast fed 3 months and  when the baby started sleeping longer nights  was difficult to coordinate her feeding schedule. Ended up pumping 7 months. felt like I had an over supply.  Reason for consult: Follow-up assessment;Other (Comment) (mom encouraged  to call for feeding assessment )  Baby is 49 hours old, and has been to the breast x 2 15 -20 mins, Latch score 10 -6, and several attempts.  2 voids , 2 mec.  Due to baby being an Early term infant - LC provided the LPT information if needed and asked mom to call with feeding  Cues for a latch assessment.  Mother informed of post-discharge support and given phone number to the lactation department, including services for phone  call assistance; out-patient appointments; and breastfeeding support group. List of other breastfeeding resources in the community  given in the handout. Encouraged mother to call for problems or concerns related to breastfeeding.   Maternal Data Does the patient have breastfeeding experience prior to this delivery?: Yes  Feeding Feeding Type:  (per mom recently attempted to latch , sleepy )  LATCH Score/Interventions                Intervention(s): Breastfeeding basics reviewed     Lactation Tools Discussed/Used     Consult Status Consult Status: Follow-up Date: 04/09/16 Follow-up type: In-patient    Michelle Copeland 04/09/2016, 3:48 PM

## 2016-04-10 MED ORDER — PRENATAL MULTIVITAMIN CH: 1.0000 | ORAL_TABLET | Freq: Every day | ORAL | Status: DC

## 2016-04-10 MED ORDER — VENLAFAXINE HCL ER 37.5 MG PO CP24: 37.5000 mg | ORAL_CAPSULE | Freq: Every day | ORAL | Status: DC

## 2016-04-10 MED ORDER — CETIRIZINE HCL 10 MG PO CAPS: 1.0000 | ORAL_CAPSULE | Freq: Every evening | ORAL | Status: AC

## 2016-04-10 MED ORDER — IBUPROFEN 800 MG PO TABS
800.0000 mg | ORAL_TABLET | Freq: Three times a day (TID) | ORAL | Status: DC | PRN
Start: 2016-04-10 — End: 2018-11-24

## 2016-04-10 NOTE — Lactation Note (Signed)
This note was copied from a baby's chart. Lactation Consultation Note  It has been 2.5 hours since the last feeding. Baby has been sleeping since then. Mom states this is the most restful she has been after a feeding. [name] was giving subtle feeding cues but fell back asleep when she was in her mom's arms. Mom will continue to hold [name] and try to feed her when she is more awake. Patient Name: Michelle Copeland [id number] Date: 04/10/2016 Reason for consult: Follow-up assessment   Maternal Data    Feeding Feeding Type: Breast Fed Length of feed: 15 min  LATCH Score/Interventions Latch: Too sleepy or reluctant, no latch achieved, no sucking elicited.  Audible Swallowing: None  Type of Nipple: Everted at rest and after stimulation  Comfort (Breast/Nipple): Filling, red/small blisters or bruises, mild/mod discomfort     Hold (Positioning): No assistance needed to correctly position infant at breast.  LATCH Score: 5  Lactation Tools Discussed/Used Tools: Nipple Shields Nipple shield size: 24   Consult Status Consult Status: Follow-up Date: 04/19/16 Follow-up type: Out-patient    Van Clines 04/10/2016, 12:06 PM

## 2016-04-10 NOTE — Lactation Note (Signed)
This note was copied from a baby's chart. Lactation Consultation Note Baby has not been latching consistently and was hungry overnight so she was fed a small amount of powdered formula that mom brought from home. Explained formula preparation explained and instructions given. Mom reports that baby has been chewing assisted with deeper latch but chewing continued.  Nipple shield # 24 introduced. Mother reported increases comforts and some swallows were heard.  There was colostrum in the NS when baby detached.  Parents were taught how to identify swallows.  Plan is to Feed on cue, post-pump, feed any expressed BM back to the baby and follow-up with lactation next week.  Mom aware of support groups.  Patient Name: Michelle Copeland [id number] Date: 04/10/2016     Maternal Data    Feeding    LATCH Score/Interventions                      Lactation Tools Discussed/Used     Consult Status      Van Clines 04/10/2016, 8:45 AM

## 2016-04-10 NOTE — Progress Notes (Signed)
Post Partum Day 2 Subjective: no complaints, up ad lib, voiding, tolerating PO and nl lochia, pain controlled, rash on abdomen  Objective: Blood pressure 124/74, pulse 60, temperature 98 F (36.7 C), temperature source Oral, resp. rate 18, height 5\' 6"  (1.676 m), weight 86.637 kg (191 lb), SpO2 98 %, unknown if currently breastfeeding.  Physical Exam:  General: alert and no distress Lochia: appropriate Uterine Fundus: firm Abd - fine rash on abdomen   Recent Labs  04/08/16 1955 04/09/16 0543  HGB 14.4 12.3  HCT 40.4 35.5*    Assessment/Plan: Discharge home, Breastfeeding and Lactation consult.  D/c with anti-itch - d/w lactation.  F/u 6 weeks.  D/c with motrin, PNV - will d/c with Zyrtec per lactation   LOS: 2 days   [place], Suhail Peloquin 04/10/2016, 9:21 AM

## 2016-04-19 ENCOUNTER — Ambulatory Visit (HOSPITAL_COMMUNITY)
Admit: 2016-04-19 | Discharge: 2016-04-19 | Disposition: A | Discharge status: Home / Self Care | Payer: BLUE CROSS/BLUE SHIELD | Attending: Obstetrics and Gynecology | Admitting: Obstetrics and Gynecology

## 2016-04-19 NOTE — Lactation Note (Signed)
Lactation Consult  Mother's reason for visit: per mom help with latch , baby falls asleep at the breast , but then seems hungry  30 -40 mins later. She sits with the nipple in her mouth.  Visit Type:  Feeding assessment  Appointment Notes:  NS, Biting, 37 weeks , left message, confirmed 7/5  Consult:  Initial Lactation Consultant:  Myer Haff  ________________________________________________________________________ 14 Name: Cherre Robins Date of Birth: 04/09/2018 Pediatrician: Dr. Marciano Sequin - [facility]  Gender: female Gestational Age: [redacted]w[redacted]d (At Birth) Birth Weight: 6 lb 7.4 oz (2931 g) Weight at Discharge: Weight: 6 lb 2.1 oz (2780 g)Date of Discharge: 04/10/2016 [facility] Weights   [birth year]/09/16 0010 04/09/16 2335  Weight: 6 lb 7.4 oz (2931 g) 6 lb 2.1 oz (2780 g)   Last weight taken from location outside of [facility]:6-3 oz , 7/3  Location:Pediatrician's office Weight today: 6-5.0 oz , 2864 g    ________________________________________________________________________  Mother's Name: Veverly Fells Type of delivery:  Vaginal Delivery  Breastfeeding Experience: 2nd baby  Maternal Medical Conditions:  No risk - milk came in well 6/29  Maternal Medications:  PNV , Motrin once a day, Zyrtec   ________________________________________________________________________  Breastfeeding History (Post Discharge)  Frequency of breastfeeding:  Every 2-3 hours and on demand  Duration of feeding: 30 min - 6o mins   Supplementing: occasionally with Dr. Owens Shark slow flow - volume 60 ml for a feeding  Pumping: DEBP - Medela  Every 3 hours - after feeding 1- 1/2 - 2 oz , 3-4 oz if she hasn't eaten   Infant Intake and Output Assessment  Voids: 8  in 24 hrs.  Color:  Clear yellow Stools:  8  in 24 hrs.  Color:  Yellow  ________________________________________________________________________  Maternal Breast Assessment - per mom initially 1st 2  days days baby was biting with latch and caused  Soreness , and resolved prior to milk coming in and stopped using the NS to latch .   Breast:  Full Nipple:  Erect Pain level:  0 Pain interventions:  Expressed breast milk  _______________________________________________________________________ Feeding Assessment/Evaluation  Initial feeding assessment:  Infant's oral assessment:  Variance - short labial frenulum, upper lip stretches well with exam and at the latch)  High palate, suspect a posterior short frenulum, but doesn't interfere with latch and obtaining depth at this consult.  Noted at the latch if the if the depth isn't deep, and slow flow baby has a tendency to become very non - nutritive.   Positioning:  Football Right breast  LATCH documentation:  Latch:  2 = Grasps breast easily, tongue down, lips flanged, rhythmical sucking.  Audible swallowing:  2 = Spontaneous and intermittent  Type of nipple:  2 = Everted at rest and after stimulation  Comfort (Breast/Nipple):  1 = Filling, red/small blisters or bruises, mild/mod discomfort  Hold (Positioning):  1 = Assistance needed to correctly position infant at breast and maintain latch  LATCH score:  8   Attached assessment:  Shallow @ 1st - LC assisted for depth   Lips flanged:  Yes.    Lips untucked:  No.  Suck assessment:  Nutritive and Nonnutritive  Tools:  Colostrum container for hand expressing and hand pump  Instructed on use and cleaning of tool:  Yes.    Pre-feed weight:  2864 g , 6-5.0 oz  Post-feed weight:  2896 g , 6-6.2 oz  Amount transferred: 32 ml  Amount supplemented: none   Additional Feeding Assessment -  Infant's oral assessment:  Variance - see above note   Positioning:  Football Left breast  LATCH documentation:  Latch:  2 = Grasps breast easily, tongue down, lips flanged, rhythmical sucking.  Audible swallowing:  2 = Spontaneous and intermittent  Type of nipple:  2 = Everted at rest and  after stimulation  Comfort (Breast/Nipple):  1 = Filling, red/small blisters or bruises, mild/mod discomfort  Hold (Positioning):  1 = Assistance needed to correctly position infant at breast and maintain latch  LATCH score:  8   Attached assessment:  Shallow @ 1st , LC assisted to obtain depth   Lips flanged:  Yes.    Lips untucked:  No., eased down chin , improved latch   Suck assessment:  Nutritive and Nonnutritive  Tools:  None  Instructed on use and cleaning of tool:  No   Wet changed - re-weight   Pre-feed weight: 2890 g , 6-5.9 oz  Post-feed weight:  2892 g , 6-6.0 oz  Amount transferred:  2 ml  Amount supplemented:  None   Additional feeding:  Pre- feed weight - 2892 g , 6-6.0 oz  Post - feed weight- 2918 g  Amount transferred: 26 ml    Total amount pumped post feed:  Didn't have time   Total amount transferred:  60 ml Total supplement given:  None   Lactation Impression:mom is very motivated to make breast feeding work and has been very busy multiply tasking  Breast feeding and pumping. Mom has a good understanding of a Early term infant due to her 1st baby being one per mom  Per mom 1st baby wasn't a slow eater and sleepy baby like this one.  Milk came in within normal range of time with some engorgement, but has resolved  Baby has picked up 2 oz from  weight check at [facility] this past Monday.  Per mom has supplemented for a feeding 60 ml with Dr, Owens Shark nipple and baby tolerated well.  nfant's oral assessment:  Variance - short labial frenulum, upper lip stretches well with exam and at the latch)  High palate, suspect a posterior short frenulum, but doesn't interfere with latch and obtaining depth at this consult.  Noted at the latch if the if the depth isn't deep, and slow flow baby has a tendency to become very non - nutritive.    Lactation Plan of care:  Praised mom for her efforts breast feeding and pumping  Feedings - every 2-3 hours and on demand ( feeding  cues ) Skin to skin feedings until [name] can stay awake for a feeding  Watch for hanging out - non - nutritive feeding Release suction if [name] is not participating in the feeding - active feeding pattern Try breast compressions - stimulation if no response - release suction  Growth Spurts - 7-10 days / 3 weeks/ 6 weeks Cluster feedings normal  Always soften 1st breast prior to offering the 2nd breast.  Extra pumping - PRN and after 5-6 feedings for 10 -15 mins.  Keep in mind at night - maybe able to just release quick - hand pump.

## 2018-03-19 ENCOUNTER — Encounter: Payer: Self-pay | Admitting: Gastroenterology

## 2018-04-03 ENCOUNTER — Ambulatory Visit: Payer: BLUE CROSS/BLUE SHIELD | Admitting: Gastroenterology

## 2018-11-24 ENCOUNTER — Other Ambulatory Visit: Payer: Self-pay

## 2018-11-24 ENCOUNTER — Emergency Department: Payer: BLUE CROSS/BLUE SHIELD

## 2018-11-24 ENCOUNTER — Encounter: Payer: Self-pay | Admitting: Emergency Medicine

## 2018-11-24 ENCOUNTER — Emergency Department
Admission: EM | Admit: 2018-11-24 | Discharge: 2018-11-24 | Disposition: A | Discharge status: Home / Self Care | Payer: BLUE CROSS/BLUE SHIELD | Attending: Emergency Medicine | Admitting: Emergency Medicine

## 2018-11-24 DIAGNOSIS — J45909 Unspecified asthma, uncomplicated: Secondary | ICD-10-CM | POA: Insufficient documentation

## 2018-11-24 DIAGNOSIS — S8391XA Sprain of unspecified site of right knee, initial encounter: Secondary | ICD-10-CM | POA: Insufficient documentation

## 2018-11-24 DIAGNOSIS — S8991XA Unspecified injury of right lower leg, initial encounter: Secondary | ICD-10-CM | POA: Diagnosis present

## 2018-11-24 DIAGNOSIS — W1842XA Slipping, tripping and stumbling without falling due to stepping into hole or opening, initial encounter: Secondary | ICD-10-CM | POA: Insufficient documentation

## 2018-11-24 DIAGNOSIS — Z91040 Latex allergy status: Secondary | ICD-10-CM | POA: Diagnosis not present

## 2018-11-24 DIAGNOSIS — Y999 Unspecified external cause status: Secondary | ICD-10-CM | POA: Diagnosis not present

## 2018-11-24 DIAGNOSIS — Y939 Activity, unspecified: Secondary | ICD-10-CM | POA: Insufficient documentation

## 2018-11-24 DIAGNOSIS — Z79899 Other long term (current) drug therapy: Secondary | ICD-10-CM | POA: Diagnosis not present

## 2018-11-24 DIAGNOSIS — Y929 Unspecified place or not applicable: Secondary | ICD-10-CM | POA: Diagnosis not present

## 2018-11-24 DIAGNOSIS — Z23 Encounter for immunization: Secondary | ICD-10-CM | POA: Diagnosis not present

## 2018-11-24 MED ORDER — OXYCODONE-ACETAMINOPHEN 5-325 MG PO TABS
1.0000 | ORAL_TABLET | Freq: Once | ORAL | Status: AC
  Administered 2018-11-24: 1 via ORAL
  Filled 2018-11-24: qty 1

## 2018-11-24 MED ORDER — OXYCODONE-ACETAMINOPHEN 5-325 MG PO TABS: 1.0000 | ORAL_TABLET | Freq: Three times a day (TID) | ORAL | 0 refills | Status: DC | PRN

## 2018-11-24 MED ORDER — OXYCODONE-ACETAMINOPHEN 5-325 MG PO TABS: 1.0000 | ORAL_TABLET | Freq: Three times a day (TID) | ORAL | 0 refills | Status: AC | PRN

## 2018-11-24 MED ORDER — TETANUS-DIPHTH-ACELL PERTUSSIS 5-2.5-18.5 LF-MCG/0.5 IM SUSP
0.5000 mL | Freq: Once | INTRAMUSCULAR | Status: AC
Start: 2018-11-24 — End: 2018-11-24
  Administered 2018-11-24: 0.5 mL via INTRAMUSCULAR
  Filled 2018-11-24: qty 0.5

## 2018-11-24 MED ORDER — BACITRACIN-NEOMYCIN-POLYMYXIN 400-5-5000 EX OINT
TOPICAL_OINTMENT | Freq: Once | CUTANEOUS | Status: AC
Start: 2018-11-24 — End: 2018-11-24
  Administered 2018-11-24: 1 via TOPICAL
  Filled 2018-11-24: qty 1

## 2018-11-24 MED ORDER — IBUPROFEN 600 MG PO TABS: 600.0000 mg | ORAL_TABLET | Freq: Four times a day (QID) | ORAL | 0 refills | Status: DC | PRN

## 2018-11-24 MED ORDER — IBUPROFEN 600 MG PO TABS: 600.0000 mg | ORAL_TABLET | Freq: Four times a day (QID) | ORAL | 0 refills | Status: AC | PRN

## 2018-11-24 NOTE — ED Notes (Signed)
Pt to the er for pasin to the right knee. Pt fell while moving a vanity and fell into an opening of the air conditioning vent. Pt scraped her leg and her leg became stuck. Pt has an abrasion to the lateral side of the right leg just distal to the knee. Pt states her leg became twisted.

## 2018-11-24 NOTE — ED Triage Notes (Signed)
Pt here with c/o pain after falling in a hole while carrying a heavy bathroom vanity, pain in right knee, unable to put pressure on it. Tearful in triage.

## 2018-11-24 NOTE — ED Provider Notes (Signed)
[facility] Emergency Department Provider Note  ____________________________________________  Time seen: Approximately 5:40 PM  I have reviewed the triage vital signs and the nursing notes.   HISTORY  Chief Complaint Knee Injury    HPI Michelle Copeland is a 33 y.o. female that presents to the emergency department for evaluation of right knee pain after injury tonight.  Patient states that she stepped in a hole in her knee twisted.  She cut the side of her knee on an air conditioning.  She has been able to bear weight and walk but with significant pain.  She is unsure of last tetanus shot.  Past Medical History:  Diagnosis Date  . Asthma    does not use inhaler regularly  . Cervical cancer ([place]) 2007   Treated with radiation, laser and cryo  . History of cervical cancer 2007  . Vaginal Pap smear, abnormal     Patient Active Problem List   Diagnosis Date Noted  . SVD (spontaneous vaginal delivery) 04/09/2016  . Postpartum care following vaginal delivery 04/09/2016  . Active labor at term 04/08/2016  . NSVD (normal spontaneous vaginal delivery) 05/31/2014  . Labor and delivery indication for care or intervention 05/30/2014  . Supervision of normal first pregnancy in first trimester 11/04/2013  . History of LEEP (loop electrosurgical excision procedure) of cervix complicating pregnancy in first trimester 11/04/2013    Past Surgical History:  Procedure Laterality Date  . APPENDECTOMY  2007  . APPENDECTOMY  2007  . LEEP  2007  . TONSILLECTOMY    . WISDOM TOOTH EXTRACTION  2005  . WISDOM TOOTH EXTRACTION      Prior to Admission medications   Medication Sig Start Date End Date Taking? Authorizing Provider  Cetirizine HCl 10 MG CAPS Take 1 capsule (10 mg total) by mouth Nightly. 04/10/16   Bovard-Stuckert, Jeral Fruit, MD  Docosahexaenoic Acid (DHA PO) Take 1 tablet by mouth daily.    [provider]  ibuprofen (ADVIL,MOTRIN) 600 MG tablet Take 1 tablet  (600 mg total) by mouth every 6 (six) hours as needed. 11/24/18   Laban Emperor, PA-C  ondansetron (ZOFRAN-ODT) 4 MG disintegrating tablet Take 4 mg by mouth every 8 (eight) hours as needed for nausea or vomiting. Reported on 04/08/2016    [provider]  oxyCODONE-acetaminophen (PERCOCET) 5-325 MG tablet Take 1 tablet by mouth every 8 (eight) hours as needed for up to 3 days for severe pain. 11/24/18 11/27/18  Laban Emperor, PA-C  Prenatal Vit-Fe Fumarate-FA (PRENATAL MULTIVITAMIN) TABS tablet Take 1 tablet by mouth daily at 12 noon. 04/10/16   Bovard-Stuckert, Jeral Fruit, MD  SUMAtriptan (IMITREX) 100 MG tablet Take 1 tablet (100 mg total) by mouth once. May repeat in 2 hours if headache persists or recurs. Patient not taking: Reported on 04/08/2016 11/19/14   Roselee Culver, MD  venlafaxine XR (EFFEXOR-XR) 37.5 MG 24 hr capsule Take 1 capsule (37.5 mg total) by mouth daily. 04/10/16   Bovard-Stuckert, Jeral Fruit, MD    Allergies Latex; Banana; and Codeine  Family History  Problem Relation Age of Onset  . Lupus Mother   . Hypertension Mother   . Thyroid cancer Mother   . Hyperlipidemia Mother     Social History Social History   Tobacco Use  . Smoking status: Never Smoker  . Smokeless tobacco: Never Used  Substance Use Topics  . Alcohol use: No    Comment: stopped since pregant  . Drug use: No     Review of Systems  Cardiovascular: No chest pain. Respiratory: No SOB. Gastrointestinal: No nausea, no vomiting.  Musculoskeletal: Positive for knee pain. Skin: Negative for rash, abrasions, lacerations, ecchymosis. Neurological: Negative for numbness   ____________________________________________   PHYSICAL EXAM:  VITAL SIGNS: ED Triage Vitals  Enc Vitals Group     BP 11/24/18 1604 (!) 156/108     Pulse Rate 11/24/18 1604 95     Resp 11/24/18 1604 18     Temp 11/24/18 1604 (!) 97.4 F (36.3 C)     Temp Source 11/24/18 1604 Oral     SpO2 11/24/18 1604 98 %     Weight  11/24/18 1605 155 lb (70.3 kg)     Height 11/24/18 1605 5\' 6"  (1.676 m)     Head Circumference --      Peak Flow --      Pain Score 11/24/18 1604 9     Pain Loc --      Pain Edu? --      Excl. in [place]? --      Constitutional: Alert and oriented. Well appearing and in no acute distress. Eyes: Conjunctivae are normal. PERRL. EOMI. Head: Atraumatic. ENT:      Ears:      Nose: No congestion/rhinnorhea.      Mouth/Throat: Mucous membranes are moist.  Neck: No stridor. Cardiovascular: Normal rate, regular rhythm.  Good peripheral circulation. Respiratory: Normal respiratory effort without tachypnea or retractions. Lungs CTAB. Good air entry to the bases with no decreased or absent breath sounds. Musculoskeletal: Full range of motion to all extremities. No gross deformities appreciated.  Tenderness to palpation of bilateral knee. No effusion noted. Negative anterior drawer, posterior drawer, mcMurray, patella apprehension, apley grind.  Pain with valgus and varus maneuvers. Neurologic:  Normal speech and language. No gross focal neurologic deficits are appreciated.  Skin:  Skin is warm, dry. Abrasion to lateral proximal shin.  Psychiatric: Mood and affect are normal. Speech and behavior are normal. Patient exhibits appropriate insight and judgement.   ____________________________________________   LABS (all labs ordered are listed, but only abnormal results are displayed)  Labs Reviewed - No data to display ____________________________________________  EKG   ____________________________________________  RADIOLOGY Robinette Haines, personally viewed and evaluated these images (plain radiographs) as part of my medical decision making, as well as reviewing the written report by the radiologist.  Dg Knee Complete 4 Views Right  Result Date: 11/24/2018 CLINICAL DATA:  Stepped into a hole in the floor where an [facility] register had been removed, with pain and swelling to lateral RIGHT knee  EXAM: RIGHT KNEE - COMPLETE 4+ VIEW COMPARISON:  None FINDINGS: Osseous mineralization normal for technique. Joint spaces preserved. No acute fracture, dislocation, or bone destruction. No knee joint effusion. IMPRESSION: Normal exam. Electronically Signed   By: Lavonia Dana M.D.   On: 11/24/2018 16:57    ____________________________________________    PROCEDURES  Procedure(s) performed:    Procedures    Medications  oxyCODONE-acetaminophen (PERCOCET/ROXICET) 5-325 MG per tablet 1 tablet (1 tablet Oral Given 11/24/18 1746)  neomycin-bacitracin-polymyxin (NEOSPORIN) ointment packet (1 application Topical Given 11/24/18 1746)  Tdap (BOOSTRIX) injection 0.5 mL (0.5 mLs Intramuscular Given 11/24/18 1747)     ____________________________________________   INITIAL IMPRESSION / ASSESSMENT AND PLAN / ED COURSE  Pertinent labs & imaging results that were available during my care of the patient were reviewed by me and considered in my medical decision making (see chart for details).  Review of the [place] CSRS was performed in accordance of  the [place] prior to dispensing any controlled drugs.   Patient's diagnosis is consistent with knee sprain.  Knee immobilizer was placed.  Crutches were given.  Patient has an abrasion to lateral knee.  Neosporin was placed.  Bandage was placed.  Tetanus shot was updated.  Patient Michelle be discharged home with prescriptions for Motrin and a short course of Percocet.. Patient is to follow up with ortho as directed. Patient is given ED precautions to return to the ED for any worsening or new symptoms.     ____________________________________________  FINAL CLINICAL IMPRESSION(S) / ED DIAGNOSES  Final diagnoses:  Sprain of right knee, unspecified ligament, initial encounter      NEW MEDICATIONS STARTED DURING THIS VISIT:  ED Discharge Orders         Ordered    ibuprofen (ADVIL,MOTRIN) 600 MG tablet  Every 6 hours PRN,   Status:  Discontinued     11/24/18  1813    oxyCODONE-acetaminophen (PERCOCET) 5-325 MG tablet  Every 8 hours PRN,   Status:  Discontinued     11/24/18 1813    ibuprofen (ADVIL,MOTRIN) 600 MG tablet  Every 6 hours PRN     11/24/18 1832    oxyCODONE-acetaminophen (PERCOCET) 5-325 MG tablet  Every 8 hours PRN     11/24/18 1832              This chart was dictated using voice recognition software/Dragon. Despite best efforts to proofread, errors can occur which can change the meaning. Any change was purely unintentional.    Laban Emperor, PA-C 11/24/18 2009    Carrie Mew, MD 11/29/18 [phone]

## 2019-04-01 DIAGNOSIS — R51 Headache: Secondary | ICD-10-CM | POA: Diagnosis not present

## 2019-04-01 DIAGNOSIS — F419 Anxiety disorder, unspecified: Secondary | ICD-10-CM | POA: Diagnosis not present

## 2019-04-01 DIAGNOSIS — F320 Major depressive disorder, single episode, mild: Secondary | ICD-10-CM | POA: Diagnosis not present

## 2019-04-01 DIAGNOSIS — R635 Abnormal weight gain: Secondary | ICD-10-CM | POA: Diagnosis not present

## 2019-04-14 DIAGNOSIS — F1020 Alcohol dependence, uncomplicated: Secondary | ICD-10-CM | POA: Diagnosis not present

## 2019-04-14 DIAGNOSIS — F411 Generalized anxiety disorder: Secondary | ICD-10-CM | POA: Diagnosis not present

## 2019-04-14 DIAGNOSIS — F3340 Major depressive disorder, recurrent, in remission, unspecified: Secondary | ICD-10-CM | POA: Diagnosis not present

## 2019-04-15 DIAGNOSIS — F1020 Alcohol dependence, uncomplicated: Secondary | ICD-10-CM | POA: Diagnosis not present

## 2019-04-15 DIAGNOSIS — F410 Panic disorder [episodic paroxysmal anxiety] without agoraphobia: Secondary | ICD-10-CM | POA: Diagnosis not present

## 2019-04-15 DIAGNOSIS — F411 Generalized anxiety disorder: Secondary | ICD-10-CM | POA: Diagnosis not present

## 2019-04-15 DIAGNOSIS — F3340 Major depressive disorder, recurrent, in remission, unspecified: Secondary | ICD-10-CM | POA: Diagnosis not present

## 2019-04-24 DIAGNOSIS — F3340 Major depressive disorder, recurrent, in remission, unspecified: Secondary | ICD-10-CM | POA: Diagnosis not present

## 2019-04-24 DIAGNOSIS — F411 Generalized anxiety disorder: Secondary | ICD-10-CM | POA: Diagnosis not present

## 2019-04-24 DIAGNOSIS — F410 Panic disorder [episodic paroxysmal anxiety] without agoraphobia: Secondary | ICD-10-CM | POA: Diagnosis not present

## 2019-04-24 DIAGNOSIS — F1020 Alcohol dependence, uncomplicated: Secondary | ICD-10-CM | POA: Diagnosis not present

## 2019-04-30 DIAGNOSIS — F3340 Major depressive disorder, recurrent, in remission, unspecified: Secondary | ICD-10-CM | POA: Diagnosis not present

## 2019-04-30 DIAGNOSIS — F410 Panic disorder [episodic paroxysmal anxiety] without agoraphobia: Secondary | ICD-10-CM | POA: Diagnosis not present

## 2019-04-30 DIAGNOSIS — F411 Generalized anxiety disorder: Secondary | ICD-10-CM | POA: Diagnosis not present

## 2019-04-30 DIAGNOSIS — F1020 Alcohol dependence, uncomplicated: Secondary | ICD-10-CM | POA: Diagnosis not present

## 2019-05-21 DIAGNOSIS — F410 Panic disorder [episodic paroxysmal anxiety] without agoraphobia: Secondary | ICD-10-CM | POA: Diagnosis not present

## 2019-05-21 DIAGNOSIS — F411 Generalized anxiety disorder: Secondary | ICD-10-CM | POA: Diagnosis not present

## 2019-05-21 DIAGNOSIS — F3340 Major depressive disorder, recurrent, in remission, unspecified: Secondary | ICD-10-CM | POA: Diagnosis not present

## 2019-05-21 DIAGNOSIS — F1020 Alcohol dependence, uncomplicated: Secondary | ICD-10-CM | POA: Diagnosis not present

## 2019-06-17 DIAGNOSIS — F410 Panic disorder [episodic paroxysmal anxiety] without agoraphobia: Secondary | ICD-10-CM | POA: Diagnosis not present

## 2019-06-17 DIAGNOSIS — F411 Generalized anxiety disorder: Secondary | ICD-10-CM | POA: Diagnosis not present

## 2019-06-17 DIAGNOSIS — F329 Major depressive disorder, single episode, unspecified: Secondary | ICD-10-CM | POA: Diagnosis not present

## 2019-08-12 DIAGNOSIS — F329 Major depressive disorder, single episode, unspecified: Secondary | ICD-10-CM | POA: Diagnosis not present

## 2019-08-12 DIAGNOSIS — F410 Panic disorder [episodic paroxysmal anxiety] without agoraphobia: Secondary | ICD-10-CM | POA: Diagnosis not present

## 2019-08-12 DIAGNOSIS — F411 Generalized anxiety disorder: Secondary | ICD-10-CM | POA: Diagnosis not present

## 2019-09-09 DIAGNOSIS — F410 Panic disorder [episodic paroxysmal anxiety] without agoraphobia: Secondary | ICD-10-CM | POA: Diagnosis not present

## 2019-09-09 DIAGNOSIS — F329 Major depressive disorder, single episode, unspecified: Secondary | ICD-10-CM | POA: Diagnosis not present

## 2019-09-09 DIAGNOSIS — F411 Generalized anxiety disorder: Secondary | ICD-10-CM | POA: Diagnosis not present

## 2019-11-03 DIAGNOSIS — F410 Panic disorder [episodic paroxysmal anxiety] without agoraphobia: Secondary | ICD-10-CM | POA: Diagnosis not present

## 2019-11-03 DIAGNOSIS — F411 Generalized anxiety disorder: Secondary | ICD-10-CM | POA: Diagnosis not present

## 2019-11-03 DIAGNOSIS — F329 Major depressive disorder, single episode, unspecified: Secondary | ICD-10-CM | POA: Diagnosis not present

## 2019-12-23 ENCOUNTER — Ambulatory Visit
Admission: EM | Admit: 2019-12-23 | Discharge: 2019-12-23 | Disposition: A | Discharge status: Home / Self Care | Payer: BC Managed Care – PPO

## 2019-12-23 ENCOUNTER — Other Ambulatory Visit: Payer: Self-pay

## 2019-12-23 DIAGNOSIS — J0100 Acute maxillary sinusitis, unspecified: Secondary | ICD-10-CM | POA: Diagnosis not present

## 2019-12-23 MED ORDER — AMOXICILLIN-POT CLAVULANATE 875-125 MG PO TABS: 1.0000 | ORAL_TABLET | Freq: Two times a day (BID) | ORAL | 0 refills | Status: DC

## 2019-12-23 NOTE — Discharge Instructions (Addendum)
Take antibiotic as directed with food. Follow up with PCP to establish care and have additional work up if needed.

## 2019-12-23 NOTE — ED Triage Notes (Signed)
Pt c/o nasal congestion x11 days. States using OTC with no relief. States had to use her rescue inhaler for relief.

## 2019-12-23 NOTE — ED Provider Notes (Signed)
[name]    CSN: [id number] Arrival date & time: 12/23/19  0813      History   Chief Complaint Chief Complaint  Patient presents with  . Nasal Congestion    HPI Michelle Copeland is a 34 y.o. female with history of asthma presenting for 11-day course of nasal congestion, postnasal drip, sinus pressure.  States for the last 2 days her teeth have not been hurting.  Has been using Sudafed, Flonase, Zyrtec (recently changed from [place]) without significant relief.  Denying fever, ear pain, eye pain, difficulty breathing or swallowing, cough, known sick exposure.   Past Medical History:  Diagnosis Date  . Asthma    does not use inhaler regularly  . Cervical cancer ([place]) 2007   Treated with radiation, laser and cryo  . History of cervical cancer 2007  . Vaginal Pap smear, abnormal     Patient Active Problem List   Diagnosis Date Noted  . SVD (spontaneous vaginal delivery) 04/09/2016  . Postpartum care following vaginal delivery 04/09/2016  . Active labor at term 04/08/2016  . NSVD (normal spontaneous vaginal delivery) 05/31/2014  . Labor and delivery indication for care or intervention 05/30/2014  . Supervision of normal first pregnancy in first trimester 11/04/2013  . History of LEEP (loop electrosurgical excision procedure) of cervix complicating pregnancy in first trimester 11/04/2013    Past Surgical History:  Procedure Laterality Date  . APPENDECTOMY  2007  . APPENDECTOMY  2007  . LEEP  2007  . TONSILLECTOMY    . WISDOM TOOTH EXTRACTION  2005  . WISDOM TOOTH EXTRACTION      OB History    Gravida  2   Para  2   Term  2   Preterm      AB      Living  2     SAB      TAB      Ectopic      Multiple  0   Live Births  2            Home Medications    Prior to Admission medications   Medication Sig Start Date End Date Taking? Authorizing Provider  buPROPion (WELLBUTRIN XL) 300 MG 24 hr tablet Take 300 mg by mouth daily.   Yes  [provider]  sertraline (ZOLOFT) 25 MG tablet Take 10 mg by mouth daily.   Yes [provider]  amoxicillin-clavulanate (AUGMENTIN) 875-125 MG tablet Take 1 tablet by mouth every 12 (twelve) hours. 12/23/19   Hall-Potvin, [place], PA-C  Cetirizine HCl 10 MG CAPS Take 1 capsule (10 mg total) by mouth Nightly. 04/10/16   Bovard-Stuckert, Jeral Fruit, MD  ibuprofen (ADVIL,MOTRIN) 600 MG tablet Take 1 tablet (600 mg total) by mouth every 6 (six) hours as needed. 11/24/18   Laban Emperor, PA-C  ondansetron (ZOFRAN-ODT) 4 MG disintegrating tablet Take 4 mg by mouth every 8 (eight) hours as needed for nausea or vomiting. Reported on 04/08/2016    [provider]  SUMAtriptan (IMITREX) 100 MG tablet Take 1 tablet (100 mg total) by mouth once. May repeat in 2 hours if headache persists or recurs. Patient not taking: Reported on 04/08/2016 11/19/14   Roselee Culver, MD    Family History Family History  Problem Relation Age of Onset  . Lupus Mother   . Hypertension Mother   . Thyroid cancer Mother   . Hyperlipidemia Mother     Social History Social History   Tobacco Use  . Smoking  status: Never Smoker  . Smokeless tobacco: Never Used  Substance Use Topics  . Alcohol use: No    Comment: stopped since pregant  . Drug use: No     Allergies   Latex, Banana, and Codeine   Review of Systems As per HPI   Physical Exam Triage Vital Signs ED Triage Vitals  Enc Vitals Group     BP      Pulse      Resp      Temp      Temp src      SpO2      Weight      Height      Head Circumference      Peak Flow      Pain Score      Pain Loc      Pain Edu?      Excl. in [place]?    No data found.  Updated Vital Signs BP (!) 153/87 (BP Location: Left Arm)   Pulse (!) 102   Temp 98.7 F (37.1 C) (Oral)   Resp 18   LMP 07/27/2014   SpO2 96%   Visual Acuity Right Eye Distance:   Left Eye Distance:   Bilateral Distance:    Right Eye Near:   Left Eye Near:      Bilateral Near:     Physical Exam Constitutional:      General: She is not in acute distress.    Appearance: She is not toxic-appearing.  HENT:     Head: Normocephalic and atraumatic.     Jaw: There is normal jaw occlusion. No tenderness or pain on movement.     Right Ear: Hearing, tympanic membrane, ear canal and external ear normal. No tenderness. No mastoid tenderness.     Left Ear: Hearing, tympanic membrane, ear canal and external ear normal. No tenderness. No mastoid tenderness.     Nose: Congestion present. No nasal deformity, septal deviation or nasal tenderness.     Right Turbinates: Not swollen or pale.     Left Turbinates: Not swollen or pale.     Right Sinus: Maxillary sinus tenderness present. No frontal sinus tenderness.     Left Sinus: Maxillary sinus tenderness present. No frontal sinus tenderness.     Mouth/Throat:     Lips: Pink. No lesions.     Mouth: Mucous membranes are moist. No injury.     Pharynx: Oropharynx is clear. Uvula midline. No posterior oropharyngeal erythema or uvula swelling.     Comments: Tonsils surgically absent Cardiovascular:     Rate and Rhythm: Normal rate and regular rhythm.     Heart sounds: No murmur. No gallop.      Comments: HR bedside 93 Pulmonary:     Effort: Pulmonary effort is normal. No respiratory distress.     Breath sounds: No stridor. No wheezing, rhonchi or rales.  Musculoskeletal:     Cervical back: Normal range of motion and neck supple. No muscular tenderness.  Lymphadenopathy:     Cervical: No cervical adenopathy.  Skin:    Capillary Refill: Capillary refill takes less than 2 seconds.  Neurological:     Mental Status: She is alert and oriented to person, place, and time.      UC Treatments / Results  Labs (all labs ordered are listed, but only abnormal results are displayed) Labs Reviewed - No data to display  EKG   Radiology No results found.  Procedures Procedures (including critical care  time)  Medications  Ordered in UC Medications - No data to display  Initial Impression / Assessment and Plan / UC Course  I have reviewed the triage vital signs and the nursing notes.  Pertinent labs & imaging results that were available during my care of the patient were reviewed by me and considered in my medical decision making (see chart for details).     Patient afebrile, nontoxic.  On day 11 of symptoms.  H&P concerning for bacterial sinusitis.  Will defer Covid testing due to lack of other symptoms/known exposure and duration of sinus congestion/pressure.  Will start Augmentin today and continue supportive therapies.  Return precautions discussed, patient verbalized understanding and is agreeable to plan. Final Clinical Impressions(s) / UC Diagnoses   Final diagnoses:  Subacute maxillary sinusitis     Discharge Instructions     Take antibiotic as directed with food. Follow up with PCP to establish care and have additional work up if needed.    ED Prescriptions    Medication Sig Dispense Auth. Provider   amoxicillin-clavulanate (AUGMENTIN) 875-125 MG tablet Take 1 tablet by mouth every 12 (twelve) hours. 14 tablet Hall-Potvin, [place], PA-C     PDMP not reviewed this encounter.   Hall-Potvin, [place], [place] 12/23/19 [phone]

## 2019-12-24 ENCOUNTER — Telehealth: Payer: Self-pay | Admitting: Emergency Medicine

## 2019-12-24 MED ORDER — ALBUTEROL SULFATE HFA 108 (90 BASE) MCG/ACT IN AERS: 1.0000 | INHALATION_SPRAY | Freq: Four times a day (QID) | RESPIRATORY_TRACT | 0 refills | Status: AC | PRN

## 2019-12-24 NOTE — Telephone Encounter (Signed)
Checked in on patient, discussed medications, and encouraged return call with any continuing questions or concerns.  Patient states she forgot to ask the provider yesterday for a refill on her Albuterol rescue inhaler.  Spoke to [place], APP, who okay'd verbal order for submission.

## 2019-12-26 ENCOUNTER — Ambulatory Visit: Payer: BC Managed Care – PPO | Attending: Internal Medicine

## 2019-12-26 ENCOUNTER — Other Ambulatory Visit: Payer: Self-pay

## 2019-12-26 DIAGNOSIS — Z23 Encounter for immunization: Secondary | ICD-10-CM

## 2019-12-26 NOTE — Progress Notes (Signed)
   Covid-19 Vaccination Clinic  Name:  Samaira Elizondo    MRN: [id number] DOB: 1986-07-26  12/26/2019  Ms. Maravilla was observed post Covid-19 immunization for 15 minutes without incident. She was provided with Vaccine Information Sheet and instruction to access the V-Safe system.   Ms. Fenderson was instructed to call 911 with any severe reactions post vaccine: Marland Kitchen Difficulty breathing  . Swelling of face and throat  . A fast heartbeat  . A bad rash all over body  . Dizziness and weakness   Immunizations Administered    Name Date Dose VIS Date Route   Pfizer COVID-19 Vaccine 12/26/2019 11:17 AM 0.3 mL 09/26/2019 Intramuscular   Manufacturer: [place]   Lot: [id number]   [place]: [id number]

## 2020-01-21 ENCOUNTER — Ambulatory Visit: Payer: BC Managed Care – PPO | Attending: Internal Medicine

## 2020-01-21 DIAGNOSIS — Z23 Encounter for immunization: Secondary | ICD-10-CM

## 2020-01-21 NOTE — Progress Notes (Signed)
   Covid-19 Vaccination Clinic  Name:  Michelle Copeland    MRN: [id number] DOB: 11-Oct-1986  01/21/2020  [name] was observed post Covid-19 immunization for 15 minutes without incident. She was provided with Vaccine Information Sheet and instruction to access the V-Safe system.   [name] was instructed to call 911 with any severe reactions post vaccine: Marland Kitchen Difficulty breathing  . Swelling of face and throat  . A fast heartbeat  . A bad rash all over body  . Dizziness and weakness   Immunizations Administered    Name Date Dose VIS Date Route   Pfizer COVID-19 Vaccine 01/21/2020  8:34 AM 0.3 mL 09/26/2019 Intramuscular   Manufacturer: [place]   Lot: [id number]   [place]: [id number]

## 2020-05-25 DIAGNOSIS — F411 Generalized anxiety disorder: Secondary | ICD-10-CM | POA: Diagnosis not present

## 2020-05-25 DIAGNOSIS — F339 Major depressive disorder, recurrent, unspecified: Secondary | ICD-10-CM | POA: Diagnosis not present

## 2020-05-25 DIAGNOSIS — F410 Panic disorder [episodic paroxysmal anxiety] without agoraphobia: Secondary | ICD-10-CM | POA: Diagnosis not present

## 2020-05-25 DIAGNOSIS — F1010 Alcohol abuse, uncomplicated: Secondary | ICD-10-CM | POA: Diagnosis not present

## 2020-06-23 DIAGNOSIS — Z20822 Contact with and (suspected) exposure to covid-19: Secondary | ICD-10-CM | POA: Diagnosis not present

## 2020-06-26 ENCOUNTER — Telehealth: Payer: 59 | Admitting: Nurse Practitioner

## 2020-06-26 DIAGNOSIS — R432 Parageusia: Secondary | ICD-10-CM

## 2020-06-26 DIAGNOSIS — R059 Cough, unspecified: Secondary | ICD-10-CM

## 2020-06-26 DIAGNOSIS — R52 Pain, unspecified: Secondary | ICD-10-CM | POA: Diagnosis not present

## 2020-06-26 DIAGNOSIS — R0602 Shortness of breath: Secondary | ICD-10-CM

## 2020-06-26 DIAGNOSIS — Z20822 Contact with and (suspected) exposure to covid-19: Secondary | ICD-10-CM

## 2020-06-26 DIAGNOSIS — R05 Cough: Secondary | ICD-10-CM

## 2020-06-26 MED ORDER — BENZONATATE 100 MG PO CAPS: 100.0000 mg | ORAL_CAPSULE | Freq: Three times a day (TID) | ORAL | 0 refills | Status: DC | PRN

## 2020-06-26 MED ORDER — NAPROXEN 500 MG PO TABS: 500.0000 mg | ORAL_TABLET | Freq: Two times a day (BID) | ORAL | 0 refills | Status: DC

## 2020-06-26 NOTE — Progress Notes (Signed)
E-Visit for Corona Virus Screening  Your current symptoms could be consistent with the coronavirus.  Many health care providers can now test patients at their office but not all are.  [place] has multiple testing sites. For information on our [place] testing locations and hours go to [email or web address]  We are enrolling you in our [place] for [place] . Daily you will receive a questionnaire within the [place] website. Our COVID 19 response team will be monitoring your responses daily.  Testing Information: The COVID-19 Community Testing sites are testing BY APPOINTMENT ONLY.  You can schedule online at [email or web address]  If you do not have access to a smart phone or computer you may call [phone] for an appointment.   Additional testing sites in the Community:  . For [facility] Testing sites in [facility]  [email or web address]  . For Pop-up testing sites in [place]  [email or web address]  . For [facility] [email or web address]  . For [facility] testing in [place] and [employer] [email or web address]  . For [facility] testing in [facility]   [email or web address]  For  more information about community testing call [phone]   Please quarantine yourself while awaiting your test results. Please stay home for a minimum of 10 days from the first day of illness with improving symptoms and you have had 24 hours of no fever (without the use of Tylenol (Acetaminophen) Motrin (Ibuprofen) or any fever reducing medication).  Also - Do not get tested prior to returning to work because once you have had a positive test the test can stay  positive for more than a month in some cases.   You should wear a mask or cloth face covering over your nose and mouth if you must be around other people or animals, including pets (even at home). Try to stay at least 6 feet away from other people. This will protect the people around you.  Please continue good preventive care measures, including:  frequent hand-washing, avoid touching your face, cover coughs/sneezes, stay out of crowds and keep a 6 foot distance from others.  COVID-19 is a respiratory illness with symptoms that are similar to the flu. Symptoms are typically mild to moderate, but there have been cases of severe illness and death due to the virus.   The following symptoms may appear 2-14 days after exposure: . Fever . Cough . Shortness of breath or difficulty breathing . Chills . Repeated shaking with chills . Muscle pain . Headache . Sore throat . New loss of taste or smell . Fatigue . Congestion or runny nose . Nausea or vomiting . Diarrhea  Go to the nearest hospital ED for assessment if fever/cough/breathlessness are severe or illness seems like a threat to life.  It is vitally important that if you feel that you have an infection such as this virus or any other virus that you stay home and away from places where you may spread it to others.  You should avoid contact with people age 72 and older.   You can use medication such as A prescription cough medication called Tessalon Perles 100 mg. You may take 1-2 capsules every 8 hours as needed for cough and I have prescribed Naprosyn 500 mg. Take twice daily as needed for fever or body aches for 2 weeks  You may also take acetaminophen (Tylenol) as needed for fever.  Reduce your risk of any infection by using the same precautions used for avoiding the common cold or flu:  Marland Kitchen Wash  your hands often with soap and warm water for at least 20 seconds.  If soap and water are not readily available, use an alcohol-based hand sanitizer  with at least 60% alcohol.  . If coughing or sneezing, cover your mouth and nose by coughing or sneezing into the elbow areas of your shirt or coat, into a tissue or into your sleeve (not your hands). . Avoid shaking hands with others and consider head nods or verbal greetings only. . Avoid touching your eyes, nose, or mouth with unwashed hands.  . Avoid close contact with people who are sick. . Avoid places or events with large numbers of people in one location, like concerts or sporting events. . Carefully consider travel plans you have or are making. . If you are planning any travel outside or inside the [place], visit the CDC's Travelers' Health webpage for the latest health notices. . If you have some symptoms but not all symptoms, continue to monitor at home and seek medical attention if your symptoms worsen. . If you are having a medical emergency, call 911.  HOME CARE . Only take medications as instructed by your medical team. . Drink plenty of fluids and get plenty of rest. . A steam or ultrasonic humidifier can help if you have congestion.   GET HELP RIGHT AWAY IF YOU HAVE EMERGENCY WARNING SIGNS** FOR COVID-19. If you or someone is showing any of these signs seek emergency medical care immediately. Call 911 or proceed to your closest emergency facility if: . You develop worsening high fever. . Trouble breathing . Bluish lips or face . Persistent pain or pressure in the chest . New confusion . Inability to wake or stay awake . You cough up blood. . Your symptoms become more severe  **This list is not all possible symptoms. Contact your medical provider for any symptoms that are sever or concerning to you.  MAKE SURE YOU   Understand these instructions.  Will watch your condition.  Will get help right away if you are not doing well or get worse.  Your e-visit answers were reviewed by a board certified advanced clinical practitioner to complete your personal care plan.   Depending on the condition, your plan could have included both over the counter or prescription medications.  If there is a problem please reply once you have received a response from your provider.  Your safety is important to [place].  If you have drug allergies check your prescription carefully.    You can use MyChart to ask questions about today's visit, request a non-urgent call back, or ask for a work or school excuse for 24 hours related to this e-Visit. If it has been greater than 24 hours you will need to follow up with your provider, or enter a new e-Visit to address those concerns. You will get an e-mail in the next two days asking about your experience.  I hope that your e-visit has been valuable and will speed your recovery. Thank you for using e-visits.  5-10 minutes spent reviewing and documenting in chart.

## 2020-06-27 ENCOUNTER — Ambulatory Visit
Admission: EM | Admit: 2020-06-27 | Discharge: 2020-06-27 | Disposition: A | Discharge status: Home / Self Care | Payer: 59 | Attending: Emergency Medicine | Admitting: Emergency Medicine

## 2020-06-27 ENCOUNTER — Encounter: Payer: Self-pay | Admitting: Emergency Medicine

## 2020-06-27 ENCOUNTER — Other Ambulatory Visit: Payer: Self-pay

## 2020-06-27 DIAGNOSIS — Z1152 Encounter for screening for COVID-19: Secondary | ICD-10-CM

## 2020-06-27 DIAGNOSIS — R197 Diarrhea, unspecified: Secondary | ICD-10-CM

## 2020-06-27 DIAGNOSIS — R05 Cough: Secondary | ICD-10-CM

## 2020-06-27 DIAGNOSIS — Z20822 Contact with and (suspected) exposure to covid-19: Secondary | ICD-10-CM

## 2020-06-27 DIAGNOSIS — R059 Cough, unspecified: Secondary | ICD-10-CM

## 2020-06-27 NOTE — ED Triage Notes (Signed)
Pt sts exposure to covid at work last week sometime; pt sts cough, body aches, nausea and diarrhea

## 2020-06-27 NOTE — ED Provider Notes (Addendum)
[name]    CSN: [id number] Arrival date & time: 06/27/20  0859      History   Chief Complaint Chief Complaint  Patient presents with  . Cough    HPI Michelle Copeland is a 34 y.o. female  Presenting for Covid testing: Exposure: coworker Date of exposure: last week Any fever, symptoms since exposure: Yes-cough, myalgias, nausea, diarrhea.  States she has an albuterol inhaler at home: Does not need refill.  Has been taking naproxen with some relief of myalgias.  Patient denies fever, shortness of breath, chest pain or palpitations, change in urination.   Past Medical History:  Diagnosis Date  . Asthma    does not use inhaler regularly  . Cervical cancer ([place]) 2007   Treated with radiation, laser and cryo  . History of cervical cancer 2007  . Vaginal Pap smear, abnormal     Patient Active Problem List   Diagnosis Date Noted  . SVD (spontaneous vaginal delivery) 04/09/2016  . Postpartum care following vaginal delivery 04/09/2016  . Active labor at term 04/08/2016  . NSVD (normal spontaneous vaginal delivery) 05/31/2014  . Labor and delivery indication for care or intervention 05/30/2014  . Supervision of normal first pregnancy in first trimester 11/04/2013  . History of LEEP (loop electrosurgical excision procedure) of cervix complicating pregnancy in first trimester 11/04/2013    Past Surgical History:  Procedure Laterality Date  . APPENDECTOMY  2007  . APPENDECTOMY  2007  . LEEP  2007  . TONSILLECTOMY    . WISDOM TOOTH EXTRACTION  2005  . WISDOM TOOTH EXTRACTION      OB History    Gravida  2   Para  2   Term  2   Preterm      AB      Living  2     SAB      TAB      Ectopic      Multiple  0   Live Births  2            Home Medications    Prior to Admission medications   Medication Sig Start Date End Date Taking? Authorizing Provider  albuterol (VENTOLIN HFA) 108 (90 Base) MCG/ACT inhaler Inhale 1-2 puffs into the lungs  every 6 (six) hours as needed for wheezing or shortness of breath. 12/24/19   Wieters, Hallie C, PA-C  buPROPion (WELLBUTRIN XL) 300 MG 24 hr tablet Take 300 mg by mouth daily.    [provider]  Cetirizine HCl 10 MG CAPS Take 1 capsule (10 mg total) by mouth Nightly. 04/10/16   Bovard-Stuckert, Jeral Fruit, MD  ibuprofen (ADVIL,MOTRIN) 600 MG tablet Take 1 tablet (600 mg total) by mouth every 6 (six) hours as needed. 11/24/18   Laban Emperor, PA-C  naproxen (NAPROSYN) 500 MG tablet Take 1 tablet (500 mg total) by mouth 2 (two) times daily with a meal. 06/26/20   Hassell Done, Mary-Margaret, FNP  ondansetron (ZOFRAN-ODT) 4 MG disintegrating tablet Take 4 mg by mouth every 8 (eight) hours as needed for nausea or vomiting. Reported on 04/08/2016    [provider]  sertraline (ZOLOFT) 25 MG tablet Take 10 mg by mouth daily.    [provider]  SUMAtriptan (IMITREX) 100 MG tablet Take 1 tablet (100 mg total) by mouth once. May repeat in 2 hours if headache persists or recurs. Patient not taking: Reported on 04/08/2016 11/19/14   Roselee Culver, MD    Family History Family History  Problem Relation Age of Onset  . Lupus Mother   . Hypertension Mother   . Thyroid cancer Mother   . Hyperlipidemia Mother     Social History Social History   Tobacco Use  . Smoking status: Never Smoker  . Smokeless tobacco: Never Used  Substance Use Topics  . Alcohol use: No    Comment: stopped since pregant  . Drug use: No     Allergies   Latex, Banana, and Codeine   Review of Systems As per HPI   Physical Exam Triage Vital Signs ED Triage Vitals [06/27/20 0915]  Enc Vitals Group     BP (!) 136/95     Pulse Rate 88     Resp 20     Temp 98.3 F (36.8 C)     Temp Source Oral     SpO2 97 %     Weight      Height      Head Circumference      Peak Flow      Pain Score      Pain Loc      Pain Edu?      Excl. in [place]?    No data found.  Updated Vital Signs BP (!) 136/95 (BP  Location: Left Arm)   Pulse 88   Temp 98.3 F (36.8 C) (Oral)   Resp 20   LMP 07/27/2014   SpO2 97%   Visual Acuity Right Eye Distance:   Left Eye Distance:   Bilateral Distance:    Right Eye Near:   Left Eye Near:    Bilateral Near:     Physical Exam Constitutional:      General: She is not in acute distress.    Appearance: She is normal weight. She is ill-appearing. She is not toxic-appearing or diaphoretic.  HENT:     Head: Normocephalic and atraumatic.     Mouth/Throat:     Mouth: Mucous membranes are moist.     Pharynx: Oropharynx is clear. No oropharyngeal exudate or posterior oropharyngeal erythema.  Eyes:     General: No scleral icterus.    Conjunctiva/sclera: Conjunctivae normal.     Pupils: Pupils are equal, round, and reactive to light.  Neck:     Comments: Trachea midline, negative JVD Cardiovascular:     Rate and Rhythm: Normal rate and regular rhythm.     Heart sounds: No murmur heard.  No gallop.   Pulmonary:     Effort: Pulmonary effort is normal. No respiratory distress.     Breath sounds: No wheezing, rhonchi or rales.  Musculoskeletal:     Cervical back: Neck supple. No tenderness.  Lymphadenopathy:     Cervical: No cervical adenopathy.  Skin:    Capillary Refill: Capillary refill takes less than 2 seconds.     Coloration: Skin is not jaundiced or pale.     Findings: No rash.  Neurological:     General: No focal deficit present.     Mental Status: She is alert and oriented to person, place, and time.      UC Treatments / Results  Labs (all labs ordered are listed, but only abnormal results are displayed) Labs Reviewed  NOVEL CORONAVIRUS, NAA    EKG   Radiology No results found.  Procedures Procedures (including critical care time)  Medications Ordered in UC Medications - No data to display  Initial Impression / Assessment and Plan / UC Course  I have reviewed the triage vital signs and the nursing  notes.  Pertinent labs &  imaging results that were available during my care of the patient were reviewed by me and considered in my medical decision making (see chart for details).     Patient afebrile, nontoxic, with SpO2 97%.  Covid PCR pending.  Patient to quarantine until results are back.  Patient seen yesterday via E visit for same symptoms.  Presenting today for testing which is obtained.  Already had prescription medications called in, has not yet picked them up, but will do so today.  Return precautions discussed, patient verbalized understanding and is agreeable to plan. Final Clinical Impressions(s) / UC Diagnoses   Final diagnoses:  Encounter for screening for COVID-19  Exposure to COVID-19 virus  Cough  Diarrhea, unspecified type     Discharge Instructions     Your COVID test is pending - it is important to quarantine / isolate at home until your results are back. If you test positive and would like further evaluation for persistent or worsening symptoms, you may schedule an E-visit or virtual (video) visit throughout the [facility] app or website.  PLEASE NOTE: If you develop severe chest pain or shortness of breath please go to the ER or call 9-1-1 for further evaluation --> DO NOT schedule electronic or virtual visits for this. Please call our office for further guidance / recommendations as needed.  For information about the Covid vaccine, please visit [email or web address]    ED Prescriptions    None     PDMP not reviewed this encounter.   Hall-Potvin, [place], PA-C 06/27/20 1017    Hall-Potvin, [place], [place] 06/27/20 1018

## 2020-06-27 NOTE — Discharge Instructions (Addendum)
Your COVID test is pending - it is important to quarantine / isolate at home until your results are back. °If you test positive and would like further evaluation for persistent or worsening symptoms, you may schedule an E-visit or virtual (video) visit throughout the [facility] MyChart app or website. ° °PLEASE NOTE: If you develop severe chest pain or shortness of breath please go to the ER or call 9-1-1 for further evaluation --> DO NOT schedule electronic or virtual visits for this. °Please call our office for further guidance / recommendations as needed. ° °For information about the Covid vaccine, please visit [email or web address] °

## 2020-06-28 ENCOUNTER — Telehealth: Payer: 59 | Admitting: Nurse Practitioner

## 2020-06-28 DIAGNOSIS — Z20822 Contact with and (suspected) exposure to covid-19: Secondary | ICD-10-CM | POA: Diagnosis not present

## 2020-06-28 LAB — SARS-COV-2, NAA 2 DAY TAT

## 2020-06-28 LAB — NOVEL CORONAVIRUS, NAA: SARS-CoV-2, NAA: NOT DETECTED

## 2020-06-28 MED ORDER — NAPROXEN 500 MG PO TABS: 500.0000 mg | ORAL_TABLET | Freq: Two times a day (BID) | ORAL | 0 refills | Status: AC

## 2020-06-28 NOTE — Progress Notes (Signed)
E-Visit for Corona Virus Screening  Your current symptoms could be consistent with the coronavirus.  Many health care providers can now test patients at their office but not all are.  [place] has multiple testing sites. For information on our COVID testing locations and hours go to [email or web address]  If you did rapid test , they are not always accurate. You have all the classic symptoms, I am surprised you have tested negative. Even though you have had vaccines, doe snot completely rule out that you will not get it.  * your best bet would be to retest. I have called in naprosyn for fever and body aches that you can take along with tessalon perles. We cannot send you for infusion unless you test positive for covid.  COVID 19 response team will be monitoring your responses daily.  Testing Information: The COVID-19 Community Testing sites are testing BY APPOINTMENT ONLY.  You can schedule online at [email or web address]  If you do not have access to a smart phone or computer you may call [phone] for an appointment.   Additional testing sites in the Community:  . For [facility] Testing sites in [facility]  [email or web address]  . For Pop-up testing sites in [place]  BowlDirectory.co.uk  . For [facility] [email or web address]  . For [facility] testing in [place] and [employer] [email or web address]  . For [facility] testing in [facility]   [email or web address]  For  more information about community testing call [phone]   Please quarantine yourself while awaiting your test results. Please stay home for a minimum of 10 days from the first day  of illness with improving symptoms and you have had 24 hours of no fever (without the use of Tylenol (Acetaminophen) Motrin (Ibuprofen) or any fever reducing medication).  Also - Do not get tested prior to returning to work because once you have had a positive test the test can stay positive for more than a month in some cases.   You should wear a mask or cloth face covering over your nose and mouth if you must be around other people or animals, including pets (even at home). Try to stay at least 6 feet away from other people. This will protect the people around you.  Please continue good preventive care measures, including:  frequent hand-washing, avoid touching your face, cover coughs/sneezes, stay out of crowds and keep a 6 foot distance from others.  COVID-19 is a respiratory illness with symptoms that are similar to the flu. Symptoms are typically mild to moderate, but there have been cases of severe illness and death due to the virus.   The following symptoms may appear 2-14 days after exposure: . Fever . Cough . Shortness of breath or difficulty breathing . Chills . Repeated shaking with chills . Muscle pain . Headache . Sore throat . New loss of taste or smell . Fatigue . Congestion or runny nose . Nausea or vomiting . Diarrhea  Go to the nearest hospital ED for assessment if fever/cough/breathlessness are severe or illness seems like a threat to life.  It is vitally important that if you feel that you have an infection such as this virus or any other virus that you stay home and away from places where you may spread it to others.  You should avoid contact with people age 27 and older.   You can use medication such as I have prescribed Naprosyn 500 mg. Take twice daily as needed for fever or body aches for  2 weeks  You may also take acetaminophen (Tylenol) as needed for fever.  Reduce your risk of any infection by using the same precautions used for avoiding the common cold or flu:   Marland Kitchen Wash your hands often with soap and warm water for at least 20 seconds.  If soap and water are not readily available, use an alcohol-based hand sanitizer with at least 60% alcohol.  . If coughing or sneezing, cover your mouth and nose by coughing or sneezing into the elbow areas of your shirt or coat, into a tissue or into your sleeve (not your hands). . Avoid shaking hands with others and consider head nods or verbal greetings only. . Avoid touching your eyes, nose, or mouth with unwashed hands.  . Avoid close contact with people who are sick. . Avoid places or events with large numbers of people in one location, like concerts or sporting events. . Carefully consider travel plans you have or are making. . If you are planning any travel outside or inside the [place], visit the CDC's Travelers' Health webpage for the latest health notices. . If you have some symptoms but not all symptoms, continue to monitor at home and seek medical attention if your symptoms worsen. . If you are having a medical emergency, call 911.  HOME CARE . Only take medications as instructed by your medical team. . Drink plenty of fluids and get plenty of rest. . A steam or ultrasonic humidifier can help if you have congestion.   GET HELP RIGHT AWAY IF YOU HAVE EMERGENCY WARNING SIGNS** FOR COVID-19. If you or someone is showing any of these signs seek emergency medical care immediately. Call 911 or proceed to your closest emergency facility if: . You develop worsening high fever. . Trouble breathing . Bluish lips or face . Persistent pain or pressure in the chest . New confusion . Inability to wake or stay awake . You cough up blood. . Your symptoms become more severe  **This list is not all possible symptoms. Contact your medical provider for any symptoms that are sever or concerning to you.  MAKE SURE YOU   Understand these instructions.  Will watch your condition.  Will get help right away if you are not  doing well or get worse.  Your e-visit answers were reviewed by a board certified advanced clinical practitioner to complete your personal care plan.  Depending on the condition, your plan could have included both over the counter or prescription medications.  If there is a problem please reply once you have received a response from your provider.  Your safety is important to [place].  If you have drug allergies check your prescription carefully.    You can use MyChart to ask questions about today's visit, request a non-urgent call back, or ask for a work or school excuse for 24 hours related to this e-Visit. If it has been greater than 24 hours you will need to follow up with your provider, or enter a new e-Visit to address those concerns. You will get an e-mail in the next two days asking about your experience.  I hope that your e-visit has been valuable and will speed your recovery. Thank you for using e-visits.  5-10 minutes spent reviewing and documenting in chart.

## 2020-07-01 ENCOUNTER — Ambulatory Visit
Admission: EM | Admit: 2020-07-01 | Discharge: 2020-07-01 | Disposition: A | Discharge status: Home / Self Care | Payer: 59

## 2020-07-01 ENCOUNTER — Ambulatory Visit: Payer: Self-pay

## 2020-07-01 ENCOUNTER — Other Ambulatory Visit: Payer: Self-pay

## 2020-07-01 ENCOUNTER — Encounter: Payer: Self-pay | Admitting: Emergency Medicine

## 2020-07-01 DIAGNOSIS — Z1152 Encounter for screening for COVID-19: Secondary | ICD-10-CM | POA: Diagnosis not present

## 2020-07-01 DIAGNOSIS — R05 Cough: Secondary | ICD-10-CM | POA: Diagnosis not present

## 2020-07-01 DIAGNOSIS — R059 Cough, unspecified: Secondary | ICD-10-CM

## 2020-07-01 NOTE — ED Triage Notes (Signed)
Patient states she was here on Sunday. She is still c/o cough and headache. She was tested for COVID on Sunday and was negative. She states she has not worsened.

## 2020-07-01 NOTE — ED Provider Notes (Signed)
[name]    CSN: [id number] Arrival date & time: 07/01/20  1211      History   Chief Complaint Chief Complaint  Patient presents with  . Appointment  . Headache  . Cough    HPI Michelle Copeland is a 34 y.o. female.  Present for Covid testing.  Seen on Sunday by me: Please see those records for additional HPI.  States symptoms have been stable.  No fever, chest pain, shortness of breath.  Did E visit as her Covid test result was negative, and they recommended she do repeat testing.  Past Medical History:  Diagnosis Date  . Asthma    does not use inhaler regularly  . Cervical cancer ([place]) 2007   Treated with radiation, laser and cryo  . History of cervical cancer 2007  . Vaginal Pap smear, abnormal     Patient Active Problem List   Diagnosis Date Noted  . SVD (spontaneous vaginal delivery) 04/09/2016  . Postpartum care following vaginal delivery 04/09/2016  . Active labor at term 04/08/2016  . NSVD (normal spontaneous vaginal delivery) 05/31/2014  . Labor and delivery indication for care or intervention 05/30/2014  . Supervision of normal first pregnancy in first trimester 11/04/2013  . History of LEEP (loop electrosurgical excision procedure) of cervix complicating pregnancy in first trimester 11/04/2013    Past Surgical History:  Procedure Laterality Date  . APPENDECTOMY  2007  . APPENDECTOMY  2007  . LEEP  2007  . TONSILLECTOMY    . WISDOM TOOTH EXTRACTION  2005  . WISDOM TOOTH EXTRACTION      OB History    Gravida  2   Para  2   Term  2   Preterm      AB      Living  2     SAB      TAB      Ectopic      Multiple  0   Live Births  2            Home Medications    Prior to Admission medications   Medication Sig Start Date End Date Taking? Authorizing Provider  albuterol (VENTOLIN HFA) 108 (90 Base) MCG/ACT inhaler Inhale 1-2 puffs into the lungs every 6 (six) hours as needed for wheezing or shortness of breath.  12/24/19  Yes Wieters, Hallie C, PA-C  buPROPion (WELLBUTRIN XL) 300 MG 24 hr tablet Take 300 mg by mouth daily.   Yes [provider]  Cetirizine HCl 10 MG CAPS Take 1 capsule (10 mg total) by mouth Nightly. 04/10/16  Yes Bovard-Stuckert, Jody, MD  naproxen (NAPROSYN) 500 MG tablet Take 1 tablet (500 mg total) by mouth 2 (two) times daily with a meal. 06/28/20  Yes Hassell Done, Mary-Margaret, FNP  sertraline (ZOLOFT) 25 MG tablet Take 10 mg by mouth daily.   Yes [provider]  gabapentin (NEURONTIN) 100 MG capsule Take by mouth at bedtime as needed. 05/25/20   [provider]  ibuprofen (ADVIL,MOTRIN) 600 MG tablet Take 1 tablet (600 mg total) by mouth every 6 (six) hours as needed. 11/24/18   Laban Emperor, PA-C  ondansetron (ZOFRAN-ODT) 4 MG disintegrating tablet Take 4 mg by mouth every 8 (eight) hours as needed for nausea or vomiting. Reported on 04/08/2016    [provider]  SUMAtriptan (IMITREX) 100 MG tablet Take 1 tablet (100 mg total) by mouth once. May repeat in 2 hours if headache persists or recurs. Patient not taking: Reported on  04/08/2016 11/19/14   Roselee Culver, MD    Family History Family History  Problem Relation Age of Onset  . Lupus Mother   . Hypertension Mother   . Thyroid cancer Mother   . Hyperlipidemia Mother     Social History Social History   Tobacco Use  . Smoking status: Never Smoker  . Smokeless tobacco: Never Used  Substance Use Topics  . Alcohol use: No    Comment: stopped since pregant  . Drug use: No     Allergies   Latex, Banana, and Codeine   Review of Systems As per HPI   Physical Exam Triage Vital Signs ED Triage Vitals  Enc Vitals Group     BP 07/01/20 1238 132/89     Pulse Rate 07/01/20 1238 85     Resp --      Temp 07/01/20 1238 98.1 F (36.7 C)     Temp Source 07/01/20 1238 Oral     SpO2 07/01/20 1238 98 %     Weight 07/01/20 1231 154 lb (69.9 kg)     Height 07/01/20 1231 5\' 6"  (1.676 m)       Head Circumference --      Peak Flow --      Pain Score 07/01/20 1231 0     Pain Loc --      Pain Edu? --      Excl. in [place]? --    No data found.  Updated Vital Signs BP 132/89 (BP Location: Left Arm)   Pulse 85   Temp 98.1 F (36.7 C) (Oral)   Ht 5\' 6"  (1.676 m)   Wt 154 lb (69.9 kg)   LMP 07/27/2014   SpO2 98%   BMI 24.86 kg/m   Visual Acuity Right Eye Distance:   Left Eye Distance:   Bilateral Distance:    Right Eye Near:   Left Eye Near:    Bilateral Near:     Physical Exam Constitutional:      General: She is not in acute distress. HENT:     Head: Normocephalic and atraumatic.  Eyes:     General: No scleral icterus.    Pupils: Pupils are equal, round, and reactive to light.  Cardiovascular:     Rate and Rhythm: Normal rate and regular rhythm.  Pulmonary:     Effort: Pulmonary effort is normal. No respiratory distress.     Breath sounds: No wheezing or rhonchi.  Skin:    Coloration: Skin is not jaundiced or pale.  Neurological:     Mental Status: She is alert and oriented to person, place, and time.      UC Treatments / Results  Labs (all labs ordered are listed, but only abnormal results are displayed) Labs Reviewed  NOVEL CORONAVIRUS, NAA    EKG   Radiology No results found.  Procedures Procedures (including critical care time)  Medications Ordered in UC Medications - No data to display  Initial Impression / Assessment and Plan / UC Course  I have reviewed the triage vital signs and the nursing notes.  Pertinent labs & imaging results that were available during my care of the patient were reviewed by me and considered in my medical decision making (see chart for details).     Patient afebrile, nontoxic, with SpO2 98%.  Sx improving per pt, exam stable.  Covid PCR pending.  Patient to quarantine until results are back.  We will treat supportively as outlined below.  Return precautions discussed, patient  verbalized understanding and  is agreeable to plan. Final Clinical Impressions(s) / UC Diagnoses   Final diagnoses:  Encounter for screening for COVID-19  Cough     Discharge Instructions     Your COVID test is pending - it is important to quarantine / isolate at home until your results are back. If you test positive and would like further evaluation for persistent or worsening symptoms, you may schedule an E-visit or virtual (video) visit throughout the [facility] app or website.  PLEASE NOTE: If you develop severe chest pain or shortness of breath please go to the ER or call [phone] for further evaluation --> DO NOT schedule electronic or virtual visits for this. Please call our office for further guidance / recommendations as needed.  For information about the Covid vaccine, please visit [email or web address]    ED Prescriptions    None     PDMP not reviewed this encounter.   Hall-Potvin, [place], [place] 07/01/20 1345

## 2020-07-01 NOTE — Discharge Instructions (Signed)
Your COVID test is pending - it is important to quarantine / isolate at home until your results are back. °If you test positive and would like further evaluation for persistent or worsening symptoms, you may schedule an E-visit or virtual (video) visit throughout the [facility] MyChart app or website. ° °PLEASE NOTE: If you develop severe chest pain or shortness of breath please go to the ER or call [phone] for further evaluation --> DO NOT schedule electronic or virtual visits for this. °Please call our office for further guidance / recommendations as needed. ° °For information about the Covid vaccine, please visit [email or web address] °

## 2020-07-03 LAB — SARS-COV-2, NAA 2 DAY TAT

## 2020-07-03 LAB — NOVEL CORONAVIRUS, NAA: SARS-CoV-2, NAA: NOT DETECTED

## 2021-07-19 DIAGNOSIS — F411 Generalized anxiety disorder: Secondary | ICD-10-CM | POA: Diagnosis not present

## 2021-07-19 DIAGNOSIS — F339 Major depressive disorder, recurrent, unspecified: Secondary | ICD-10-CM | POA: Diagnosis not present

## 2022-03-17 ENCOUNTER — Other Ambulatory Visit: Payer: Self-pay | Admitting: Family Medicine

## 2022-08-31 DIAGNOSIS — K219 Gastro-esophageal reflux disease without esophagitis: Secondary | ICD-10-CM | POA: Diagnosis not present

## 2022-08-31 DIAGNOSIS — R1084 Generalized abdominal pain: Secondary | ICD-10-CM | POA: Diagnosis not present

## 2022-08-31 DIAGNOSIS — R634 Abnormal weight loss: Secondary | ICD-10-CM | POA: Diagnosis not present

## 2022-09-19 DIAGNOSIS — R634 Abnormal weight loss: Secondary | ICD-10-CM | POA: Diagnosis not present

## 2022-09-19 DIAGNOSIS — R1011 Right upper quadrant pain: Secondary | ICD-10-CM | POA: Diagnosis not present

## 2022-09-22 DIAGNOSIS — R1011 Right upper quadrant pain: Secondary | ICD-10-CM | POA: Diagnosis not present

## 2022-09-22 DIAGNOSIS — K219 Gastro-esophageal reflux disease without esophagitis: Secondary | ICD-10-CM | POA: Diagnosis not present

## 2022-09-22 DIAGNOSIS — R634 Abnormal weight loss: Secondary | ICD-10-CM | POA: Diagnosis not present

## 2022-09-22 DIAGNOSIS — N281 Cyst of kidney, acquired: Secondary | ICD-10-CM | POA: Diagnosis not present

## 2022-10-02 DIAGNOSIS — R1011 Right upper quadrant pain: Secondary | ICD-10-CM | POA: Diagnosis not present

## 2024-04-11 ENCOUNTER — Other Ambulatory Visit (HOSPITAL_COMMUNITY): Payer: Self-pay
